# Patient Record
Sex: Male | Born: 1945
Health system: Southern US, Community
[De-identification: ages and names within clinical notes are randomized; demographics above are authoritative.]

## PROBLEM LIST (undated history)

## (undated) DIAGNOSIS — R519 Headache, unspecified: Secondary | ICD-10-CM

## (undated) DIAGNOSIS — R51 Headache: Secondary | ICD-10-CM

## (undated) HISTORY — DX: Headache: R51

## (undated) HISTORY — PX: OTHER SURGICAL HISTORY: SHX169

## (undated) HISTORY — DX: Headache, unspecified: R51.9

## (undated) HISTORY — PX: COLON SURGERY: SHX602

---

## 2005-10-29 ENCOUNTER — Ambulatory Visit: Payer: Self-pay | Admitting: Family Medicine

## 2010-01-08 ENCOUNTER — Ambulatory Visit: Payer: Self-pay | Admitting: Occupational Medicine

## 2010-01-10 ENCOUNTER — Ambulatory Visit: Payer: Self-pay | Admitting: Family Medicine

## 2010-01-10 DIAGNOSIS — J1189 Influenza due to unidentified influenza virus with other manifestations: Secondary | ICD-10-CM

## 2010-01-10 LAB — CONVERTED CEMR LAB: Inflenza A Ag: POSITIVE

## 2010-01-15 ENCOUNTER — Ambulatory Visit: Payer: Self-pay | Admitting: Family Medicine

## 2010-01-15 ENCOUNTER — Telehealth (INDEPENDENT_AMBULATORY_CARE_PROVIDER_SITE_OTHER): Payer: Self-pay | Admitting: *Deleted

## 2010-01-16 LAB — CONVERTED CEMR LAB
Basophils Absolute: 0 10*3/uL (ref 0.0–0.1)
Basophils Relative: 0 % (ref 0–1)
Eosinophils Absolute: 0.1 10*3/uL (ref 0.0–0.7)
Eosinophils Relative: 1 % (ref 0–5)
Hemoglobin: 15.4 g/dL (ref 13.0–17.0)
MCHC: 33.6 g/dL (ref 30.0–36.0)
MCV: 87.1 fL (ref 78.0–100.0)
Monocytes Absolute: 0.7 10*3/uL (ref 0.1–1.0)
Neutro Abs: 3.5 10*3/uL (ref 1.7–7.7)
RDW: 12.5 % (ref 11.5–15.5)

## 2011-01-08 NOTE — Progress Notes (Signed)
Summary: Not feeling any better  Phone Note Call from Patient   Caller: Spouse Summary of Call: Wife states Pt is not feeling any better. C/o being very achy and no appetite. Please advise.  Initial call taken by: Payton Spark CMA,  January 15, 2010 11:02 AM  Follow-up for Phone Call        Has the flu.  If feels getting worse then come in because it can turn into PNA but flu can take 7 days-10 days to recover.  Follow-up by: Nani Gasser MD,  January 15, 2010 11:21 AM  Additional Follow-up for Phone Call Additional follow up Details #1::        Pt came in for apt Additional Follow-up by: Payton Spark CMA,  January 15, 2010 4:47 PM

## 2011-01-08 NOTE — Letter (Signed)
Summary: Out of Work  [facility]  [address]   [place], [place] [address]   Phone: [phone]  Fax: [phone]    January 10, 2010   Employee:  Angel Krueger    To Whom It May Concern:   For Medical reasons, please excuse the above named employee from work for the following dates:  Start:   01-08-2010  End:   12-15-2009  If you need additional information, please feel free to contact our office.         Sincerely,    Nani Gasser MD

## 2011-01-08 NOTE — Assessment & Plan Note (Signed)
Summary: COLD, PAIN W/COUGH/TJ   Vital Signs:  Patient Profile:   65 Years Old Male CC:      Cold & URI symptoms Height:     64 inches Weight:      153 pounds O2 Sat:      99 % O2 treatment:    Room Air Temp:     98.9 degrees F oral Pulse rate:   84 / minute Resp:     16 per minute BP sitting:   141 / 94  (right arm)  Pt. in pain?   yes    Location:   body aches    Type:       aching  Vitals Entered By: Lita Mains, RN                   Updated Prior Medication List: No Medications Current Allergies (reviewed today): ! PCNHistory of Present Illness History from: patient Chief Complaint: Cold & URI symptoms History of Present Illness: Patient has had dry cough, runny nose, body aches, burning eyes X 3 weeks. 10 days ago he received a Z-pak. He was well for about 4-5 days and symptoms returned. He has not c/o fever and is currentlt a-febrile. No history of sinus congestion or pressure.  No sore throat.   He denies any history of seasonal allergies.   No history of asthma.    He did got the flu shot earlier.   He is taking no medications at all.  Has not tried ibuprofen.   He does have a remote history of heartburn, but none recently.   He is a non smoker and has never smoked before.   REVIEW OF SYSTEMS Constitutional Symptoms       Complains of fatigue.     Denies fever, chills, night sweats, weight loss, and weight gain.  Eyes       Complains of eye pain.      Denies change in vision, eye discharge, glasses, contact lenses, and eye surgery. Ear/Nose/Throat/Mouth       Complains of frequent runny nose and hoarseness.      Denies hearing loss/aids, change in hearing, ear pain, ear discharge, dizziness, frequent nose bleeds, sinus problems, sore throat, and tooth pain or bleeding.  Respiratory       Complains of dry cough.      Denies productive cough, wheezing, shortness of breath, asthma, bronchitis, and emphysema/COPD.  Cardiovascular       Denies murmurs, chest pain,  and tires easily with exhertion.    Gastrointestinal       Denies stomach pain, nausea/vomiting, diarrhea, constipation, blood in bowel movements, and indigestion. Genitourniary       Denies painful urination, kidney stones, and loss of urinary control. Neurological       Denies paralysis, seizures, and fainting/blackouts. Musculoskeletal       Denies muscle pain, joint pain, joint stiffness, decreased range of motion, redness, swelling, muscle weakness, and gout.      Comments: body aches Skin       Denies bruising, unusual mles/lumps or sores, and hair/skin or nail changes.  Psych       Denies mood changes, temper/anger issues, anxiety/stress, speech problems, depression, and sleep problems.  Past History:  Past Surgical History: Last updated: 08/14/2007 Panendoscopy  Family History: Last updated: 08/14/2007 Family History Diabetes 1st degree relative  Social History: Last updated: 08/14/2007 Occupation: Married Never Smoked Alcohol use-no Drug use-no Regular exercise-no  Past Medical History: Reviewed history from  08/14/2007 and no changes required. Stomach Ulcer Glaucoma Migraines  Family History: Reviewed history from 08/14/2007 and no changes required. Family History Diabetes 1st degree relative  Social History: Reviewed history from 08/14/2007 and no changes required. Occupation: Married Never Smoked Alcohol use-no Drug use-no Regular exercise-no Physical Exam General appearance: well developed, well nourished, no acute distress Pupils: equal, round, reactive to light Ears: normal, no lesions or deformities Nasal: mucosa pink, nonedematous, no septal deviation, turbinates normal Oral/Pharynx: tongue normal, posterior pharynx without erythema or exudate Neck: neck supple,  trachea midline, no masses Chest/Lungs: no rales, wheezes, or rhonchi bilateral, breath sounds equal without effort  Dry cough Heart: regular rate and  rhythm, no  murmur Assessment New Problems: COUGH (ICD-786.2) UPPER RESPIRATORY INFECTION, ACUTE (ICD-465.9)   Plan New Medications/Changes: PULMICORT FLEXHALER 180 MCG/ACT AEPB (BUDESONIDE) 1 inhalation two times a day  #One inh. x 0, 01/08/2010, Kathrine Haddock MD CHERATUSSIN AC 100-10 MG/5ML SYRP (GUAIFENESIN-CODEINE) 5 ml every 8 hours for cough  #4 oz x 0, 01/08/2010, Kathrine Haddock MD  New Orders: T-Chest x-ray, 2 views [71020] New Patient Level III [99203] Planning Comments:   I think this allergies may be the source of his cough Recommend Pulmicort two times a day and Cheratussin at night Recommend Claritin otc as directed Recommend follow up with PCP next week if no better.   Chest x-ray was negative.   The patient and/or caregiver has been counseled thoroughly with regard to medications prescribed including dosage, schedule, interactions, rationale for use, and possible side effects and they verbalize understanding.  Diagnoses and expected course of recovery discussed and will return if not improved as expected or if the condition worsens. Patient and/or caregiver verbalized understanding.  Prescriptions: PULMICORT FLEXHALER 180 MCG/ACT AEPB (BUDESONIDE) 1 inhalation two times a day  #One inh. x 0   Entered and Authorized by:   Kathrine Haddock MD   Signed by:   Kathrine Haddock MD on 01/08/2010   Method used:   Print then Give to Patient   RxID:   [id number] CHERATUSSIN AC 100-10 MG/5ML SYRP (GUAIFENESIN-CODEINE) 5 ml every 8 hours for cough  #4 oz x 0   Entered and Authorized by:   Kathrine Haddock MD   Signed by:   Kathrine Haddock MD on 01/08/2010   Method used:   Print then Give to Patient   RxID:   [id number]

## 2011-01-08 NOTE — Assessment & Plan Note (Signed)
Summary: NOV: Flu   Vital Signs:  Patient profile:   65 year old male Height:      64 inches Weight:      154 pounds BMI:     26.53 O2 Sat:      96 % on Room air Temp:     98.6 degrees F oral Pulse rate:   78 / minute BP sitting:   127 / 75  (left arm) Cuff size:   regular  Vitals Entered By: Kathlene November (January 10, 2010 11:15 AM)  O2 Flow:  Room air CC: cough and bodyaches since Monday Is Patient Diabetic? No   CC:  cough and bodyaches since Monday.  History of Present Illness: cough and bodyaches since Sunday night.  Went to UC on Monday and given a cough medicine and inhaler. Had a normal CXR.  Feels cold like chills.  No fevers.  Cough is more dry. No hx of lungs problems.  Not a smoker. cough med only works for about 2 hours.  Tooka zpack for about 2 weeks and was better for about a week. Feels weak all over.  Muscles are achey.  Did have a flu shot. No runny nose.   Habits & Providers  Alcohol-Tobacco-Diet     Alcohol drinks/day: 0     Tobacco Status: never  Exercise-Depression-Behavior     Does Patient Exercise: no     STD Risk: never     Drug Use: never     Seat Belt Use: always  Current Medications (verified): 1)  Cheratussin Ac 100-10 Mg/49ml Syrp (Guaifenesin-Codeine) .... 5 Ml Every 8 Hours For Cough 2)  Pulmicort Flexhaler 180 Mcg/act Aepb (Budesonide) .Marland Kitchen.. 1 Inhalation Two Times A Day 3)  Zithromax 250 Mg Tabs (Azithromycin) 4)  Propranolol Hcl 80 Mg Tabs (Propranolol Hcl) .... Take One Tablet By Mouth Once A Day  Allergies (verified): 1)  ! Pcn  Comments:  Nurse/Medical Assistant: The patient's medications and allergies were reviewed with the patient and were updated in the Medication and Allergy Lists. Kathlene November (January 10, 2010 11:18 AM)  Past History:  Past Medical History: Last updated: 08/14/2007 Stomach Ulcer Glaucoma Migraines  Past Surgical History: Last updated: 08/14/2007 Panendoscopy  Family History: Reviewed history  from 08/14/2007 and no changes required. 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  Married to Ramia with 2 kids. Never Smoked Alcohol use-no Drug use-no Regular exercise-no STD Risk:  never Drug Use:  never Seat Belt Use:  always  Review of Systems       No fever/sweats/weakness, unexplained weight loss/gain.  No vison changes.  No difficulty hearing/ringing in ears, hay fever/allergies.  No chest pain/discomfort, palpitations.  No Br lump/nipple discharge.  No cough/wheeze.  No blood in BM, nausea/vomiting/diarrhea.  No nighttime urination, leaking urine, unusual vaginal bleeding, discharge (penis or vagina).  No muscle/joint pain. No rash, change in mole.  No HA, memory loss.  No anxiety, sleep d/o, depression.  No easy bruising/bleeding, unexplained lump   Physical Exam  General:  Well-developed,well-nourished,in no acute distress; alert,appropriate and cooperative throughout examination Head:  Normocephalic and atraumatic without obvious abnormalities. No apparent alopecia or balding. Eyes:  No corneal or conjunctival inflammation noted. EOMI. Perrla.  Ears:  External ear exam shows no significant lesions or deformities.  Otoscopic examination reveals clear canals, tympanic membranes are intact bilaterally without bulging, retraction, inflammation or discharge. Hearing is grossly normal bilaterally. Nose:  External nasal examination shows no deformity  or inflammation. Nasal mucosa are pink and moist without lesions or exudates. Mouth:  Oral mucosa and oropharynx without lesions or exudates.  Teeth in good repair. Neck:  No deformities, masses, or tenderness noted. Lungs:  Normal respiratory effort, chest expands symmetrically. Lungs are clear to auscultation, no crackles or wheezes. Heart:  Normal rate and regular rhythm. S1 and S2 normal without gallop, murmur, click, rub or other extra sounds. Pulses:  Radial 2+  Neurologic:  alert &  oriented X3.   Skin:  no rashes.   Cervical Nodes:  No lymphadenopathy noted Psych:  Cognition and judgment appear intact. Alert and cooperative with normal attention span and concentration. No apparent delusions, illusions, hallucinations   Impression & Recommendations:  Problem # 1:  INFLUENZA (ICD-487.8)  Out of the window for tamiflu. Symtomatic care. Cough med given. Call if not better in 1 week. IBU or Tyelnol for bodyaches.   Orders: Flu A+B ([address])  Complete Medication List: 1)  Hydrocodone-homatropine 5-1.5 Mg/77ml Syrp (Hydrocodone-homatropine) .... 5ml by mouth up to three times a day as needed cough. 2)  Propranolol Hcl 80 Mg Tabs (Propranolol hcl) .... Take one tablet by mouth once a day Prescriptions: HYDROCODONE-HOMATROPINE 5-1.5 MG/5ML SYRP (HYDROCODONE-HOMATROPINE) 5ml by mouth up to three times a day as needed cough.  #131ml x 0   Entered and Authorized by:   Nani Gasser MD   Signed by:   Nani Gasser MD on 01/10/2010   Method used:   Print then Give to Patient   RxID:   [id number]   Laboratory Results  Date/Time Received: 01/10/2010 Date/Time Reported: 01/10/2010  Other Tests  Influenza A: positive Influenza B: positive

## 2011-01-08 NOTE — Assessment & Plan Note (Signed)
Summary: F/U flu   Vital Signs:  Patient profile:   65 year old male Height:      64 inches Weight:      144.75 pounds BMI:     24.94 Temp:     97.8 degrees F oral Pulse rate:   70 / minute BP sitting:   140 / 90 CC: c/o not feeling any better, achy,weak, no appetite   CC:  c/o not feeling any better, achy, weak, and no appetite.  History of Present Illness: c/o not feeling any better, achy,weak, no appetite. No cough or fever.  Last fever a few days ago. No energy. No SOB.  Vomited  a couple of days ago. Decreased appetie. No chest or back pain. Says the bodyaches are better.   Allergies: 1)  ! Pcn  Physical Exam  General:  Well-developed,well-nourished,in no acute distress; alert,appropriate and cooperative throughout examination Head:  Normocephalic and atraumatic without obvious abnormalities. No apparent alopecia or balding. Eyes:  No corneal or conjunctival inflammation noted. EOMI. Perrla.  Ears:  External ear exam shows no significant lesions or deformities.  Otoscopic examination reveals clear canals, tympanic membranes are intact bilaterally without bulging, retraction, inflammation or discharge. Hearing is grossly normal bilaterally. Nose:  External nasal examination shows no deformity or inflammation. Nasal mucosa are pink and moist without lesions or exudates. Mouth:  Oral mucosa and oropharynx without lesions or exudates.  Teeth in good repair. Neck:  No deformities, masses, or tenderness noted. Lungs:  Normal respiratory effort, chest expands symmetrically. Lungs are clear to auscultation, no crackles or wheezes. Heart:  Normal rate and regular rhythm. S1 and S2 normal without gallop, murmur, click, rub or other extra sounds. Abdomen:  Bowel sounds positive,abdomen soft and non-tender without masses, organomegaly or hernias noted. Pulses:  Radial 2+  Neurologic:  alert & oriented X3.   Skin:  no rashes.   Cervical Nodes:  No lymphadenopathy noted Psych:   Cognition and judgment appear intact. Alert and cooperative with normal attention span and concentration. No apparent delusions, illusions, hallucinations   Impression & Recommendations:  Problem # 1:  INFLUENZA (ICD-487.8) Much improved stil having excesive fatigue so will check CBC. Explained realy just needs to give it a few more days and increase calorie intake and fluids.   Orders: T-CBC w/Diff ([id number])  Complete Medication List: 1)  Hydrocodone-homatropine 5-1.5 Mg/50ml Syrp (Hydrocodone-homatropine) .... 5ml by mouth up to three times a day as needed cough. 2)  Propranolol Hcl 80 Mg Tabs (Propranolol hcl) .... Take one tablet by mouth once a day

## 2011-01-08 NOTE — Letter (Signed)
Summary: Out of Work  [facility]  [address]   [place], [place] [address]   Phone: [phone]  Fax: [phone]    January 15, 2010   Employee:  Angel Krueger    To Whom It May Concern:   For Medical reasons, please excuse the above named employee from work for the following dates:  Start:   01-08-2010  End:   01-17-2010  If you need additional information, please feel free to contact our office.         Sincerely,    Nani Gasser MD

## 2013-02-09 ENCOUNTER — Ambulatory Visit (INDEPENDENT_AMBULATORY_CARE_PROVIDER_SITE_OTHER): Payer: Medicare Other

## 2013-02-09 ENCOUNTER — Ambulatory Visit (INDEPENDENT_AMBULATORY_CARE_PROVIDER_SITE_OTHER): Payer: Medicare Other | Admitting: Family Medicine

## 2013-02-09 ENCOUNTER — Encounter: Payer: Self-pay | Admitting: Family Medicine

## 2013-02-09 VITALS — BP 168/92 | HR 60 | Temp 97.9°F | Wt 159.0 lb

## 2013-02-09 DIAGNOSIS — N201 Calculus of ureter: Secondary | ICD-10-CM

## 2013-02-09 DIAGNOSIS — N1330 Unspecified hydronephrosis: Secondary | ICD-10-CM

## 2013-02-09 DIAGNOSIS — M549 Dorsalgia, unspecified: Secondary | ICD-10-CM

## 2013-02-09 DIAGNOSIS — R319 Hematuria, unspecified: Secondary | ICD-10-CM

## 2013-02-09 DIAGNOSIS — R109 Unspecified abdominal pain: Secondary | ICD-10-CM

## 2013-02-09 DIAGNOSIS — N200 Calculus of kidney: Secondary | ICD-10-CM

## 2013-02-09 LAB — POCT URINALYSIS DIPSTICK
Bilirubin, UA: NEGATIVE
Glucose, UA: NEGATIVE
Leukocytes, UA: NEGATIVE
Nitrite, UA: NEGATIVE
Urobilinogen, UA: 0.2

## 2013-02-09 MED ORDER — KETOROLAC TROMETHAMINE 60 MG/2ML IM SOLN
60.0000 mg | Freq: Once | INTRAMUSCULAR | Status: AC
  Administered 2013-02-09: 60 mg via INTRAMUSCULAR

## 2013-02-09 MED ORDER — HYDROCODONE-ACETAMINOPHEN 5-325 MG PO TABS: 2.0000 | ORAL_TABLET | Freq: Four times a day (QID) | ORAL | Status: DC | PRN

## 2013-02-09 MED ORDER — TAMSULOSIN HCL 0.4 MG PO CAPS: 0.4000 mg | ORAL_CAPSULE | Freq: Every day | ORAL | Status: DC

## 2013-02-09 MED ORDER — ONDANSETRON HCL 4 MG PO TABS: 4.0000 mg | ORAL_TABLET | Freq: Every day | ORAL | Status: DC | PRN

## 2013-02-09 NOTE — Progress Notes (Addendum)
CC: Angel Krueger is a 66 y.o. male is here for Pyelonephritis   Subjective: HPI:  Patient has not been seen in over three years at our clinic.  Patient was in his regular state of health up until 12:00 today when he had acute onset of 10 out of 10 right flank pain with nausea and urinary frequency. His pain has subsided to now being in moderate severity. He is unable to influence his pain for better or for worse with positional movements. His pain is not related to urinary habits other than that described above. He now localizes his pain to the right flank pain which now radiates just above the right testicle. He denies change in the color or odor consistency of his urine. He denies chest pain, cough, SOB, abdominal pain, fevers, chills, confusion, nor pelvic pain other than that described above. Denies testicular pain nor swelling.  Denies diarrhea nor constipation. Denies swollen lymph nodes or easy bruisability. Denies musculoskeletal pain, skin changes/rash, nor motor/sensory disturbances.  He denies any recent trauma or overexertion. Nothing seems to make the pain better or worse. It has been constant since onset although somewhat improving without interventions. It is described as a "deep and sharp "pain.  He denies midline back pain, saddle paresthesia, nor bowel or bladder incontinence. No personal or family history of kidney stones. He has never had a urinary tract infection before.    Review Of Systems Outlined In HPI  Past Medical History  Diagnosis Date  . Generalized headaches      History reviewed. No pertinent family history.   History  Substance Use Topics  . Smoking status: Never Smoker   . Smokeless tobacco: Not on file  . Alcohol Use: Not on file     Objective: Filed Vitals:   02/09/13 1458  BP: 168/92  Pulse: 60  Temp: 97.9 F (36.6 C)    General: Alert and Oriented,  appears to be in mild to moderate pain/discomfort  HEENT: Pupils equal, round, reactive to  light. Conjunctivae clear.   moist mucous membranes  Lungs: Clear to auscultation bilaterally, no wheezing/ronchi/rales.  Comfortable work of breathing. Good air movement. Cardiac: Regular rate and rhythm. Normal S1/S2.  No murmurs, rubs, nor gallops.   Abdomen: Normal bowel sounds, soft and non tender without palpable masses. Back: No CVA tenderness  Extremities: No peripheral edema.  Strong peripheral pulses.  Mental Status: No depression, anxiety, nor agitation. Skin: Warm and dry.  Assessment & Plan: Angel Krueger was seen today for pyelonephritis.  Diagnoses and associated orders for this visit:  Right flank pain - CT Abdomen Pelvis Wo Contrast; Future - ketorolac (TORADOL) injection 60 mg; Inject 2 mLs (60 mg total) into the muscle once.  Back pain - Urine Culture - Urinalysis Dipstick - ketorolac (TORADOL) injection 60 mg; Inject 2 mLs (60 mg total) into the muscle once.  Hematuria - CT Abdomen Pelvis Wo Contrast; Future  Nephrolithiasis - ondansetron (ZOFRAN) 4 MG tablet; Take 1 tablet (4 mg total) by mouth daily as needed for nausea. - HYDROcodone-acetaminophen (NORCO/VICODIN) 5-325 MG per tablet; Take 2 tablets by mouth every 6 (six) hours as needed for pain. - tamsulosin (FLOMAX) 0.4 MG CAPS; Take 1 capsule (0.4 mg total) by mouth daily.    High suspicion for nephrolithiasis given symptoms and blood on urinalysis. Patient was sent for a stat CT scan without contrast.   CT scan revealed mild hydronephrosis on the right side with a 2 mm stone at the UVJ. Anticipate successful passage  do to size of the stone, he was started on Flomax and given as needed hydrocodone and Zofran. Patient and wife are in agreement of presenting to emergency room if pain is uncontrolled tonight or vomiting occurs. I've asked him to followup with me or his PCP on Friday. He was given a single injection of Toradol 60 mg with great improvement of his pain. He was given a strainer in hopes of collecting the  stone  Return in about 3 days (around 02/12/2013).

## 2013-02-10 ENCOUNTER — Other Ambulatory Visit: Payer: Self-pay | Admitting: *Deleted

## 2013-02-10 MED ORDER — PROMETHAZINE HCL 25 MG PO TABS: 25.0000 mg | ORAL_TABLET | Freq: Four times a day (QID) | ORAL | Status: DC | PRN

## 2013-02-13 LAB — URINE CULTURE: Colony Count: 35000

## 2013-02-15 ENCOUNTER — Ambulatory Visit (INDEPENDENT_AMBULATORY_CARE_PROVIDER_SITE_OTHER): Payer: Medicare Other | Admitting: Family Medicine

## 2013-02-15 ENCOUNTER — Encounter: Payer: Self-pay | Admitting: Family Medicine

## 2013-02-15 VITALS — BP 140/89 | HR 74 | Ht 70.0 in | Wt 160.0 lb

## 2013-02-15 DIAGNOSIS — Z8669 Personal history of other diseases of the nervous system and sense organs: Secondary | ICD-10-CM | POA: Insufficient documentation

## 2013-02-15 LAB — LIPID PANEL
Cholesterol: 192 mg/dL (ref 0–200)
LDL Cholesterol: 126 mg/dL — ABNORMAL HIGH (ref 0–99)
VLDL: 15 mg/dL (ref 0–40)

## 2013-02-15 MED ORDER — ZOSTER VACCINE LIVE 19400 UNT/0.65ML ~~LOC~~ SOLR: 0.6500 mL | Freq: Once | SUBCUTANEOUS | Status: DC

## 2013-02-15 MED ORDER — FROVATRIPTAN SUCCINATE 2.5 MG PO TABS: 2.5000 mg | ORAL_TABLET | ORAL | Status: DC | PRN

## 2013-02-15 MED ORDER — TRIAMCINOLONE ACETONIDE 0.1 % EX CREA: TOPICAL_CREAM | Freq: Two times a day (BID) | CUTANEOUS | Status: DC

## 2013-02-15 NOTE — Progress Notes (Signed)
Subjective:    Angel Krueger is a 67 y.o. male who presents for Medicare Annual/Subsequent preventive examination.   Preventive Screening-Counseling & Management  Tobacco History  Smoking status  . Never Smoker   Smokeless tobacco  . Not on file    Problems Prior to Visit 1. nephrolithiasis  Current Problems (verified) Patient Active Problem List  Diagnosis  . INFLUENZA    Medications Prior to Visit Current Outpatient Prescriptions on File Prior to Visit  Medication Sig Dispense Refill  . tamsulosin (FLOMAX) 0.4 MG CAPS Take 1 capsule (0.4 mg total) by mouth daily.  30 capsule  0   No current facility-administered medications on file prior to visit.    Current Medications (verified) Current Outpatient Prescriptions  Medication Sig Dispense Refill  . BRIMONIDINE TARTRATE OP Apply to eye.      . dorzolamide (TRUSOPT) 2 % ophthalmic solution 1 drop 3 (three) times daily.      Marland Kitchen LATANOPROST OP Apply to eye.      . propranolol (INDERAL) 80 MG tablet Take 80 mg by mouth daily.      . tamsulosin (FLOMAX) 0.4 MG CAPS Take 1 capsule (0.4 mg total) by mouth daily.  30 capsule  0   No current facility-administered medications for this visit.     Allergies (verified) Penicillins   PAST HISTORY  Family History No family history on file.  Social History History  Substance Use Topics  . Smoking status: Never Smoker   . Smokeless tobacco: Not on file  . Alcohol Use: Not on file    Are there smokers in your home (other than you)?  No  Risk Factors Current exercise habits: The patient does not participate in regular exercise at present.  Dietary issues discussed: varried diet with veggies, dairy, and whole grains   Cardiac risk factors: advanced age (older than 33 for men, 68 for women) and hypertension.  Depression Screen (Note: if answer to either of the following is "Yes", a more complete depression screening is indicated)   Q1: Over the past two weeks, have you  felt down, depressed or hopeless? No  Q2: Over the past two weeks, have you felt little interest or pleasure in doing things? No  Have you lost interest or pleasure in daily life? No  Do you often feel hopeless? No  Do you cry easily over simple problems? No  Activities of Daily Living In your present state of health, do you have any difficulty performing the following activities?:  Driving? No Managing money?  No Feeding yourself? No Getting from bed to chair? No Climbing a flight of stairs? No Preparing food and eating?: No Bathing or showering? No Getting dressed: No Getting to the toilet? No Using the toilet:No Moving around from place to place: No In the past year have you fallen or had a near fall?:No   Are you sexually active?  No  Do you have more than one partner?  No  Hearing Difficulties: No Do you often ask people to speak up or repeat themselves? No Do you experience ringing or noises in your ears? No Do you have difficulty understanding soft or whispered voices? No   Do you feel that you have a problem with memory? No  Do you often misplace items? Yes  Do you feel safe at home?  Yes  Cognitive Testing  Alert? Yes  Normal Appearance?Yes  Oriented to person? Yes  Place? Yes   Time? Yes  Recall of three objects?  No  Can perform simple calculations? Yes  Displays appropriate judgment?Yes  Can read the correct time from a watch face?Yes   Advanced Directives have been discussed with the patient? Yes   List the Names of Other Physician/Practitioners you currently use: 1.    Indicate any recent Medical Services you may have received from other than [facility] providers in the past year (date may be approximate).  Immunization History  Administered Date(s) Administered  . Influenza Whole 09/08/2005    Screening Tests Health Maintenance  Topic Date Due  . Tetanus/tdap  09/19/1966  . Colonoscopy  09/19/1997  . Influenza Vaccine  08/09/2006  . Zostavax   09/20/2007  . Pneumococcal Polysaccharide Vaccine Age 16 And Over  09/19/2012    All answers were reviewed with the patient and necessary referrals were made:  Laren Boom, DO   02/15/2013   History reviewed: allergies, current medications, past family history, past medical history, past social history, past surgical history and problem list  Review of Systems Review of Systems - General ROS: negative for - chills, fever, night sweats, weight gain or weight loss Ophthalmic ROS: negative for - decreased vision Psychological ROS: negative for - anxiety or depression ENT ROS: negative for - hearing change, nasal congestion, tinnitus or allergies Hematological and Lymphatic ROS: negative for - bleeding problems, bruising or swollen lymph nodes Breast ROS: negative Respiratory ROS: no cough, shortness of breath, or wheezing Cardiovascular ROS: no chest pain or dyspnea on exertion Gastrointestinal ROS: no abdominal pain, change in bowel habits, or black or bloody stools Genito-Urinary ROS: negative for - genital discharge, genital ulcers, incontinence or abnormal bleeding from genitals Musculoskeletal ROS: negative for - joint pain or muscle pain Neurological ROS: negative for - headaches or memory loss Dermatological ROS: negative for lumps, mole changes, rash and skin lesion changes  Objective:     Vision by Snellen chart: right eye:20/40, left eye:20/25 Blood pressure 140/89, pulse 74, height 5\' 10"  (1.778 m), weight 160 lb (72.576 kg). Body mass index is 22.96 kg/(m^2).  General: No Acute Distress HEENT: Atraumatic, normocephalic, conjunctivae normal without scleral icterus.  No nasal discharge, hearing grossly intact, TMs with good landmarks bilaterally with small left TM perforation. with no middle ear abnormalities, posterior pharynx clear without oral lesions. Neck: Supple, trachea midline, no cervical nor supraclavicular adenopathy. Pulmonary: Clear to auscultation bilaterally  without wheezing, rhonchi, nor rales. Cardiac: Regular rate and rhythm.  No murmurs, rubs, nor gallops. No peripheral edema.  2+ peripheral pulses bilaterally. Abdomen: Bowel sounds normal.  No masses.  Non-tender without rebound.  Negative Murphy's sign. MSK: Grossly intact, no signs of weakness.  Full strength throughout upper and lower extremities.  Full ROM in upper and lower extremities.  No midline spinal tenderness. Neuro: Gait unremarkable, CN II-XII grossly intact.  C5-C6 Reflex 2/4 Bilaterally, L4 Reflex 2/4 Bilaterally.  Cerebellar function intact. Skin: Eczematous patch behind left ear Psych: Alert and oriented to person/place/time.  Thought process normal. No anxiety/depression.     Assessment:     Could only recall 2 of 3 words after clock distraction, short-term mild memory loss not interfere with quality of life.     Plan:     During the course of the visit the patient was educated and counseled about appropriate screening and preventive services including:    Pneumococcal vaccine   Prostate cancer screening  Colorectal cancer screening  Diabetes screening  Discussed precautions regarding his left tympanic membrane perforation and a recheck in 1-3 months.Healthy lifestyle interventions including but  limited to regular exercise, a healthy low fat diet, moderation of salt intake, the dangers of tobacco/alcohol/recreational drug use, nutrition supplementation, and accident avoidance were discussed with the patient and a handout was provided for future reference. Discussed returning at his convenience for Mini-Mental state exam to further look into memory loss with possibility of labs however he prefer to not have this done.  Diet review for nutrition referral?   Not Indicated   Patient Instructions (the written plan) was given to the patient.  Medicare Attestation I have personally reviewed: The patient's medical and social history Their use of alcohol, tobacco or  illicit drugs Their current medications and supplements The patient's functional ability including ADLs,fall risks, home safety risks, cognitive, and hearing and visual impairment Diet and physical activities Evidence for depression or mood disorders  The patient's weight, height, BMI, and visual acuity have been recorded in the chart.  I have made referrals, counseling, and provided education to the patient based on review of the above and I have provided the patient with a written personalized care plan for preventive services.     Laren Boom, DO   02/15/2013

## 2013-02-16 ENCOUNTER — Encounter: Payer: Self-pay | Admitting: Family Medicine

## 2013-02-16 ENCOUNTER — Other Ambulatory Visit: Payer: Self-pay | Admitting: Family Medicine

## 2013-02-16 DIAGNOSIS — E785 Hyperlipidemia, unspecified: Secondary | ICD-10-CM | POA: Insufficient documentation

## 2013-02-16 LAB — BASIC METABOLIC PANEL WITH GFR
BUN: 12 mg/dL (ref 6–23)
CO2: 29 mEq/L (ref 19–32)
Chloride: 103 mEq/L (ref 96–112)
GFR, Est Non African American: 78 mL/min
Potassium: 4.4 mEq/L (ref 3.5–5.3)

## 2013-03-15 ENCOUNTER — Ambulatory Visit (INDEPENDENT_AMBULATORY_CARE_PROVIDER_SITE_OTHER): Payer: Medicare Other | Admitting: Family Medicine

## 2013-03-15 VITALS — BP 124/73 | HR 72 | Wt 159.0 lb

## 2013-03-15 DIAGNOSIS — I10 Essential (primary) hypertension: Secondary | ICD-10-CM | POA: Insufficient documentation

## 2013-03-15 DIAGNOSIS — R030 Elevated blood-pressure reading, without diagnosis of hypertension: Secondary | ICD-10-CM

## 2013-03-15 DIAGNOSIS — G43909 Migraine, unspecified, not intractable, without status migrainosus: Secondary | ICD-10-CM

## 2013-03-15 DIAGNOSIS — Z1211 Encounter for screening for malignant neoplasm of colon: Secondary | ICD-10-CM

## 2013-03-15 MED ORDER — FROVATRIPTAN SUCCINATE 2.5 MG PO TABS: 2.5000 mg | ORAL_TABLET | ORAL | Status: DC | PRN

## 2013-03-15 NOTE — Progress Notes (Signed)
CC: Angel Krueger is a 67 y.o. male is here for Hypertension   Subjective: HPI:  Followup elevated blood pressure  No outside blood pressures to report. At recent visits for nephrolithiasis was found to be hypertensive, at recent complete physical exam still mildly hypertensive. Since then has focus on diet and exercise interventions. Denies any past history of hypertension. Takes propranolol for migraine prevention. Denies new headaches, motor sensory disturbances, chest pain, shortness of breath, orthopnea, peripheral edema.  Patient reports being put on propranolol years ago for migraines. Since I saw him last he's had one that responded to frovia samples.  Character and frequency of headaches/migraine have not changed over the years. He would like to know if he can stop propranolol.   Review Of Systems Outlined In HPI  Past Medical History  Diagnosis Date  . Generalized headaches      No family history on file.   History  Substance Use Topics  . Smoking status: Never Smoker   . Smokeless tobacco: Not on file  . Alcohol Use: Not on file     Objective: Filed Vitals:   03/15/13 0821  BP: 124/73  Pulse: 72    Vital signs reviewed. General: Alert and Oriented, No Acute Distress HEENT: Pupils equal, round, reactive to light. Conjunctivae clear.  External ears unremarkable.  Moist mucous membranes. Lungs: Clear and comfortable work of breathing, speaking in full sentences without accessory muscle use. No wheezing rhonchi nor rales Cardiac: Regular rate and rhythm.  No murmurs Neuro: CN II-XII grossly intact, gait normal. Extremities: No peripheral edema.  Strong peripheral pulses.  Mental Status: No depression, anxiety, nor agitation. Logical though process. Skin: Warm and dry.  Assessment & Plan: Angel Krueger was seen today for hypertension.  Diagnoses and associated orders for this visit:  Screening for colorectal cancer - Ambulatory referral to  Gastroenterology  Migraine - frovatriptan (FROVA) 2.5 MG tablet; Take 1 tablet (2.5 mg total) by mouth as needed for migraine. If recurs, may repeat after 2 hours. Max of 3 tabs in 24 hours.  Elevated BP    Patient is having trouble coordinating insurance with [facility] for colonoscopy, he would like a referral to someone local. Migraine: Per patient's wishes he will stop propranolol for the next week or 2 but will restart if having more than one migraine a week. Elevated blood pressure: Improved now he is no longer in pain, continue dietary and lifestyle interventions, doubt that stopping propranolol will make a significant change to blood pressure.  Return in about 6 months (around 09/14/2013).

## 2013-04-20 ENCOUNTER — Encounter: Payer: Self-pay | Admitting: Family Medicine

## 2013-04-20 DIAGNOSIS — K635 Polyp of colon: Secondary | ICD-10-CM | POA: Insufficient documentation

## 2014-06-02 ENCOUNTER — Encounter: Payer: Medicare Other | Admitting: Family Medicine

## 2015-01-19 ENCOUNTER — Encounter: Payer: Medicare Other | Admitting: Family Medicine

## 2015-01-27 ENCOUNTER — Ambulatory Visit (INDEPENDENT_AMBULATORY_CARE_PROVIDER_SITE_OTHER): Payer: Commercial Managed Care - HMO | Admitting: Family Medicine

## 2015-01-27 ENCOUNTER — Encounter: Payer: Self-pay | Admitting: Family Medicine

## 2015-01-27 VITALS — BP 147/89 | HR 67 | Ht 70.0 in | Wt 159.0 lb

## 2015-01-27 DIAGNOSIS — Z23 Encounter for immunization: Secondary | ICD-10-CM | POA: Diagnosis not present

## 2015-01-27 DIAGNOSIS — N529 Male erectile dysfunction, unspecified: Secondary | ICD-10-CM

## 2015-01-27 DIAGNOSIS — I10 Essential (primary) hypertension: Secondary | ICD-10-CM | POA: Diagnosis not present

## 2015-01-27 DIAGNOSIS — Z125 Encounter for screening for malignant neoplasm of prostate: Secondary | ICD-10-CM | POA: Diagnosis not present

## 2015-01-27 DIAGNOSIS — Z0000 Encounter for general adult medical examination without abnormal findings: Secondary | ICD-10-CM

## 2015-01-27 DIAGNOSIS — L819 Disorder of pigmentation, unspecified: Secondary | ICD-10-CM | POA: Diagnosis not present

## 2015-01-27 DIAGNOSIS — Z79899 Other long term (current) drug therapy: Secondary | ICD-10-CM | POA: Diagnosis not present

## 2015-01-27 LAB — COMPLETE METABOLIC PANEL WITH GFR
ALK PHOS: 66 U/L (ref 39–117)
ALT: 18 U/L (ref 0–53)
AST: 20 U/L (ref 0–37)
Albumin: 4.2 g/dL (ref 3.5–5.2)
BUN: 16 mg/dL (ref 6–23)
CO2: 30 mEq/L (ref 19–32)
CREATININE: 0.9 mg/dL (ref 0.50–1.35)
Calcium: 9.6 mg/dL (ref 8.4–10.5)
Chloride: 104 mEq/L (ref 96–112)
GFR, Est African American: >89 mL/min
GFR, Est Non African American: 88 mL/min
Glucose, Bld: 101 mg/dL — ABNORMAL HIGH (ref 70–99)
POTASSIUM: 4.7 meq/L (ref 3.5–5.3)
Sodium: 139 mEq/L (ref 135–145)
Total Bilirubin: 0.6 mg/dL (ref 0.2–1.2)
Total Protein: 7.6 g/dL (ref 6.0–8.3)

## 2015-01-27 LAB — CBC
HCT: 49.1 % (ref 39.0–52.0)
Hemoglobin: 16.6 g/dL (ref 13.0–17.0)
MCH: 29.4 pg (ref 26.0–34.0)
MCHC: 33.8 g/dL (ref 30.0–36.0)
MCV: 86.9 fL (ref 78.0–100.0)
MPV: 11.2 fL (ref 8.6–12.4)
PLATELETS: 178 10*3/uL (ref 150–400)
RBC: 5.65 MIL/uL (ref 4.22–5.81)
RDW: 14 % (ref 11.5–15.5)
WBC: 6.7 10*3/uL (ref 4.0–10.5)

## 2015-01-27 LAB — LIPID PANEL
Cholesterol: 188 mg/dL (ref 0–200)
HDL: 49 mg/dL (ref >39)
LDL Cholesterol: 121 mg/dL — ABNORMAL HIGH (ref 0–99)
TRIGLYCERIDES: 90 mg/dL (ref <150)
Total CHOL/HDL Ratio: 3.8 Ratio
VLDL: 18 mg/dL (ref 0–40)

## 2015-01-27 MED ORDER — ZOSTER VACCINE LIVE 19400 UNT/0.65ML ~~LOC~~ SOLR: 0.6500 mL | Freq: Once | SUBCUTANEOUS | Status: DC

## 2015-01-27 MED ORDER — IBUPROFEN 800 MG PO TABS: 800.0000 mg | ORAL_TABLET | Freq: Three times a day (TID) | ORAL | Status: DC | PRN

## 2015-01-27 MED ORDER — SILDENAFIL CITRATE 100 MG PO TABS
50.0000 mg | ORAL_TABLET | Freq: Every day | ORAL | Status: DC | PRN
Start: 2015-01-27 — End: 2016-09-09

## 2015-01-27 MED ORDER — PROPRANOLOL HCL 80 MG PO TABS: 80.0000 mg | ORAL_TABLET | Freq: Two times a day (BID) | ORAL | Status: DC

## 2015-01-27 NOTE — Patient Instructions (Addendum)
Melatonin 3-5mg  every night as needed for sleep issues.   Hydrogen Peroxide placed on a cotton ball then placed in your ear canal for 5 minutes will help reduce wax in the ear.

## 2015-01-27 NOTE — Progress Notes (Signed)
Subjective:    Angel Krueger is a 69 y.o. male who presents for Medicare Initial preventive examination.   Preventive Screening-Counseling & Management  Tobacco History  Smoking status  . Never Smoker   Smokeless tobacco  . Not on file   Colonoscopy: Receive this in 2014 not due for another 8 years Prostate: Discussed screening risks/beneifts with patient during today's visit, he would like to have a PSA checked   Influenza Vaccine: Already received this season Pneumovax: Will receive Prevnar today Td/Tdap: He believes he has had tetanus within the last 10 years Zoster: Overdue, prescription provided today  Complains of a headache that's present every day only in the mornings. It's a pounding sensation in his temples. It slowly improves over a matter of hours. It's worse with leaning forward. No outside blood pressures to report but stopped propranolol 2 years ago no other motor or sensory disturbances other than that described below  Would like to know if he can have something like Cialis or Viagra for erectile dysfunction difficulty both initiating and maintaining an erection. He denies exertional chest pain or chest pain in any other circumstances  He has a pigmented patch on the medial side of his third toe that he's had for matter of years that he's never had evaluated.   Problems Prior to Visit 1. See above  Current Problems (verified) Patient Active Problem List   Diagnosis Date Noted  . Hyperplastic colon polyp 04/20/2013  . Elevated BP 03/15/2013  . Hyperlipidemia LDL goal < 130 02/16/2013  . History of migraine 02/15/2013  . INFLUENZA 01/10/2010    Medications Prior to Visit Current Outpatient Prescriptions on File Prior to Visit  Medication Sig Dispense Refill  . aspirin EC 81 MG tablet Take 1 tablet (81 mg total) by mouth daily. 150 tablet 2  . BRIMONIDINE TARTRATE OP Apply to eye.    . dorzolamide (TRUSOPT) 2 % ophthalmic solution 1 drop 3 (three) times  daily.    Marland Kitchen. LATANOPROST OP Apply to eye.    . frovatriptan (FROVA) 2.5 MG tablet Take 1 tablet (2.5 mg total) by mouth as needed for migraine. If recurs, may repeat after 2 hours. Max of 3 tabs in 24 hours. (Patient not taking: Reported on 01/27/2015) 10 tablet 3  . propranolol (INDERAL) 80 MG tablet Take 80 mg by mouth daily.     No current facility-administered medications on file prior to visit.    Current Medications (verified) Current Outpatient Prescriptions  Medication Sig Dispense Refill  . aspirin EC 81 MG tablet Take 1 tablet (81 mg total) by mouth daily. 150 tablet 2  . BRIMONIDINE TARTRATE OP Apply to eye.    . dorzolamide (TRUSOPT) 2 % ophthalmic solution 1 drop 3 (three) times daily.    Marland Kitchen. LATANOPROST OP Apply to eye.    . vitamin B-12 (CYANOCOBALAMIN) 1000 MCG tablet Take 1,000 mcg by mouth daily.    . frovatriptan (FROVA) 2.5 MG tablet Take 1 tablet (2.5 mg total) by mouth as needed for migraine. If recurs, may repeat after 2 hours. Max of 3 tabs in 24 hours. (Patient not taking: Reported on 01/27/2015) 10 tablet 3  . propranolol (INDERAL) 80 MG tablet Take 80 mg by mouth daily.     No current facility-administered medications for this visit.     Allergies (verified) Penicillins   PAST HISTORY  Family History No family history on file.  Social History History  Substance Use Topics  . Smoking status: Never Smoker   .  Smokeless tobacco: Not on file  . Alcohol Use: Not on file    Are there smokers in your home (other than you)?  No  Risk Factors Current exercise habits: Home exercise routine includes stretching.  Dietary issues discussed: DASH diet   Cardiac risk factors: advanced age (older than 26 for men, 20 for women), hypertension and male gender.  Depression Screen (Note: if answer to either of the following is "Yes", a more complete depression screening is indicated)   Q1: Over the past two weeks, have you felt down, depressed or hopeless? No  Q2:  Over the past two weeks, have you felt little interest or pleasure in doing things? No  Have you lost interest or pleasure in daily life? No  Do you often feel hopeless? No  Do you cry easily over simple problems? No  Activities of Daily Living In your present state of health, do you have any difficulty performing the following activities?:  Driving? No Managing money?  No Feeding yourself? No Getting from bed to chair? No Climbing a flight of stairs? No Preparing food and eating?: No Bathing or showering? No Getting dressed: No Getting to the toilet? No Using the toilet:No Moving around from place to place: No In the past year have you fallen or had a near fall?:No   Are you sexually active?  Yes  Do you have more than one partner?  No  Hearing Difficulties: No Do you often ask people to speak up or repeat themselves? No Do you experience ringing or noises in your ears? No Do you have difficulty understanding soft or whispered voices? No   Do you feel that you have a problem with memory? No  Do you often misplace items? No  Do you feel safe at home?  No  Cognitive Testing  Alert? Yes  Normal Appearance?Yes  Oriented to person? Yes  Place? Yes   Time? Yes  Recall of three objects?  Yes  Can perform simple calculations? Yes  Displays appropriate judgment?Yes  Can read the correct time from a watch face?Yes   Advanced Directives have been discussed with the patient? Yes   List the Names of Other Physician/Practitioners you currently use: 1.  none  Indicate any recent Medical Services you may have received from other than [facility] providers in the past year (date may be approximate).  Immunization History  Administered Date(s) Administered  . Influenza Whole 09/08/2005  . Pneumococcal Polysaccharide-23 02/15/2013    Screening Tests Health Maintenance  Topic Date Due  . TETANUS/TDAP  09/19/1966  . ZOSTAVAX  09/20/2007  . INFLUENZA VACCINE  07/09/2014  .  COLONOSCOPY  04/17/2023  . PNEUMOCOCCAL POLYSACCHARIDE VACCINE AGE 48 AND OVER  Completed    All answers were reviewed with the patient and necessary referrals were made:  Laren Boom, DO   01/27/2015   History reviewed: allergies, current medications, past family history, past medical history, past social history, past surgical history and problem list  Review of Systems Review of Systems - General ROS: negative for - chills, fever, night sweats, weight gain or weight loss Ophthalmic ROS: negative for - decreased vision Psychological ROS: negative for - anxiety or depression ENT ROS: negative for - hearing change, nasal congestion, tinnitus or allergies Hematological and Lymphatic ROS: negative for - bleeding problems, bruising or swollen lymph nodes Breast ROS: negative Respiratory ROS: no cough, shortness of breath, or wheezing Cardiovascular ROS: no chest pain or dyspnea on exertion Gastrointestinal ROS: no abdominal pain, change in  bowel habits, or black or bloody stools Genito-Urinary ROS: negative for - genital discharge, genital ulcers, incontinence or abnormal bleeding from genitals Musculoskeletal ROS: negative for - joint pain or muscle pain Neurological ROS: negative for -  memory loss Dermatological ROS: negative for lumps, mole changes, rash and skin lesion changes other than that described above   Objective:     Vision by Snellen chart: right eye:20/25, left eye:20/40 Blood pressure 147/89, pulse 67, height  (1.778 m), weight 159 lb (72.122 kg). Body mass index is 22.81 kg/(m^2).  General: No Acute Distress HEENT: Atraumatic, normocephalic, conjunctivae normal without scleral icterus.  No nasal discharge, hearing grossly intact, TMs with good landmarks bilaterally with no middle ear abnormalities, posterior pharynx clear without oral lesions. Neck: Supple, trachea midline, no cervical nor supraclavicular adenopathy. Pulmonary: Clear to auscultation bilaterally  without wheezing, rhonchi, nor rales. Cardiac: Regular rate and rhythm.  No murmurs, rubs, nor gallops. No peripheral edema.  2+ peripheral pulses bilaterally. Abdomen: Bowel sounds normal.  No masses.  Non-tender without rebound.  Negative Murphy's sign. MSK: Grossly intact, no signs of weakness.  Full strength throughout upper and lower extremities.  Full ROM in upper and lower extremities.  No midline spinal tenderness. Neuro: Gait unremarkable, CN II-XII grossly intact.  C5-C6 Reflex 2/4 Bilaterally, L4 Reflex 2/4 Bilaterally.  Cerebellar function intact. Skin: No rashes. 2 cm diameter flat pigmented lesion medial surface of the left third toe Psych: Alert and oriented to person/place/time.  Thought process normal. No anxiety/depression.     Assessment:     Pigmented skin lesion, headaches, uncontrolled hypertension     Plan:     During the course of the visit the patient was educated and counseled about appropriate screening and preventive services including:    Viagra for erectile dysfunction, referral to dermatology for further evaluation of pigmented skin lesion. Encouraged to see eye doctor at his convenience for corrective lenses  Diet review for nutrition referral? no   Patient Instructions (the written plan) was given to the patient.  Medicare Attestation I have personally reviewed: The patient's medical and social history Their use of alcohol, tobacco or illicit drugs Their current medications and supplements The patient's functional ability including ADLs,fall risks, home safety risks, cognitive, and hearing and visual impairment Diet and physical activities Evidence for depression or mood disorders  The patient's weight, height, BMI, and visual acuity have been recorded in the chart.  I have made referrals, counseling, and provided education to the patient based on review of the above and I have provided the patient with a written personalized care plan for  preventive services.     Laren Boom, DO   01/27/2015

## 2015-01-28 LAB — PSA: PSA: 1.11 ng/mL (ref <=4.00)

## 2015-01-30 ENCOUNTER — Telehealth: Payer: Self-pay | Admitting: Family Medicine

## 2015-01-30 DIAGNOSIS — E785 Hyperlipidemia, unspecified: Secondary | ICD-10-CM

## 2015-01-30 MED ORDER — ATORVASTATIN CALCIUM 20 MG PO TABS: 20.0000 mg | ORAL_TABLET | Freq: Every day | ORAL | Status: DC

## 2015-01-30 NOTE — Telephone Encounter (Signed)
Sue Lushndrea, Will you please let patient know that his PSA prostate test, kidney function, liver function, and blood cell counts were normal.  His cholesterol is elevated to a degree that I'd recommend he start on a cholesterol loweing medication called atorvastatin, I've sent an Rx to his [facility] pharmacy.  Blood sugar was just barely elevated but not in the diabetic range.  This can be improved with engaging in 30-45 minutes of moderate exercise most days of the week. F/U 3 months.

## 2015-01-30 NOTE — Telephone Encounter (Signed)
Pt.notified

## 2015-02-23 DIAGNOSIS — H402213 Chronic angle-closure glaucoma, right eye, severe stage: Secondary | ICD-10-CM | POA: Diagnosis not present

## 2015-02-23 DIAGNOSIS — H2512 Age-related nuclear cataract, left eye: Secondary | ICD-10-CM | POA: Diagnosis not present

## 2015-02-23 DIAGNOSIS — H269 Unspecified cataract: Secondary | ICD-10-CM | POA: Diagnosis not present

## 2015-02-23 DIAGNOSIS — H402223 Chronic angle-closure glaucoma, left eye, severe stage: Secondary | ICD-10-CM | POA: Diagnosis not present

## 2015-03-09 DIAGNOSIS — H2512 Age-related nuclear cataract, left eye: Secondary | ICD-10-CM | POA: Insufficient documentation

## 2015-03-09 DIAGNOSIS — H25012 Cortical age-related cataract, left eye: Secondary | ICD-10-CM | POA: Insufficient documentation

## 2015-03-13 DIAGNOSIS — Z9841 Cataract extraction status, right eye: Secondary | ICD-10-CM | POA: Diagnosis not present

## 2015-03-13 DIAGNOSIS — Z880 Allergy status to penicillin: Secondary | ICD-10-CM | POA: Diagnosis not present

## 2015-03-13 DIAGNOSIS — Z792 Long term (current) use of antibiotics: Secondary | ICD-10-CM | POA: Diagnosis not present

## 2015-03-13 DIAGNOSIS — H2512 Age-related nuclear cataract, left eye: Secondary | ICD-10-CM | POA: Diagnosis not present

## 2015-03-13 DIAGNOSIS — H5703 Miosis: Secondary | ICD-10-CM | POA: Diagnosis not present

## 2015-03-13 DIAGNOSIS — Z7982 Long term (current) use of aspirin: Secondary | ICD-10-CM | POA: Diagnosis not present

## 2015-03-13 DIAGNOSIS — H402233 Chronic angle-closure glaucoma, bilateral, severe stage: Secondary | ICD-10-CM | POA: Diagnosis not present

## 2015-03-13 DIAGNOSIS — H402290 Chronic angle-closure glaucoma, unspecified eye, stage unspecified: Secondary | ICD-10-CM | POA: Insufficient documentation

## 2015-03-13 DIAGNOSIS — H25012 Cortical age-related cataract, left eye: Secondary | ICD-10-CM | POA: Diagnosis not present

## 2015-03-13 DIAGNOSIS — Z79899 Other long term (current) drug therapy: Secondary | ICD-10-CM | POA: Diagnosis not present

## 2015-03-13 DIAGNOSIS — Z961 Presence of intraocular lens: Secondary | ICD-10-CM | POA: Diagnosis not present

## 2015-03-14 DIAGNOSIS — Z961 Presence of intraocular lens: Secondary | ICD-10-CM | POA: Insufficient documentation

## 2015-03-17 ENCOUNTER — Telehealth: Payer: Self-pay | Admitting: Family Medicine

## 2015-03-17 DIAGNOSIS — I10 Essential (primary) hypertension: Secondary | ICD-10-CM

## 2015-03-17 MED ORDER — PROPRANOLOL HCL 80 MG PO TABS: 80.0000 mg | ORAL_TABLET | Freq: Two times a day (BID) | ORAL | Status: DC

## 2015-03-17 MED ORDER — ATORVASTATIN CALCIUM 20 MG PO TABS: 20.0000 mg | ORAL_TABLET | Freq: Every day | ORAL | Status: DC

## 2015-03-17 NOTE — Telephone Encounter (Signed)
[facility]

## 2015-04-28 ENCOUNTER — Ambulatory Visit: Payer: Medicare Other | Admitting: Family Medicine

## 2015-07-24 ENCOUNTER — Encounter: Payer: Self-pay | Admitting: Family Medicine

## 2015-07-24 ENCOUNTER — Ambulatory Visit (INDEPENDENT_AMBULATORY_CARE_PROVIDER_SITE_OTHER): Payer: Commercial Managed Care - HMO | Admitting: Family Medicine

## 2015-07-24 VITALS — BP 141/78 | HR 63 | Ht 70.0 in | Wt 155.0 lb

## 2015-07-24 DIAGNOSIS — Z7184 Encounter for health counseling related to travel: Secondary | ICD-10-CM

## 2015-07-24 DIAGNOSIS — Z7189 Other specified counseling: Secondary | ICD-10-CM | POA: Diagnosis not present

## 2015-07-24 MED ORDER — TYPHOID VACCINE PO CPDR: 1.0000 | DELAYED_RELEASE_CAPSULE | ORAL | Status: DC

## 2015-07-24 MED ORDER — ATOVAQUONE-PROGUANIL HCL 250-100 MG PO TABS: 1.0000 | ORAL_TABLET | Freq: Every day | ORAL | Status: DC

## 2015-07-24 NOTE — Progress Notes (Addendum)
CC: Angel Krueger is a 69 y.o. male is here for Follow-up   Subjective: HPI:  Patient will be traveling to [place] in regions known to have malaria, he'll be leaving in September. He will be there for 2 weeks. He wants to know if there is any medication he needs to take to prevent communicable disease is overseas. He's never had the polio vaccine. He is up-to-date on hepatitis vaccines. He's not had the flu shot yet. In October he will then be going to [place] for 4 months. He will be in regions that are known to have malaria.he will not be traveling to anywhere with Japanese encephalitis that he knows of. He will not be traveling to any regions known to have yellow fever.   Review Of Systems Outlined In HPI  Past Medical History  Diagnosis Date  . Generalized headaches     Past Surgical History  Procedure Laterality Date  . Colon surgery      Unknown specifics   No family history on file.  Social History   Social History  . Marital Status: Married    Spouse Name: N/A  . Number of Children: N/A  . Years of Education: N/A   Occupational History  . Not on file.   Social History Main Topics  . Smoking status: Never Smoker   . Smokeless tobacco: Not on file  . Alcohol Use: Not on file  . Drug Use: No  . Sexual Activity: Not on file   Other Topics Concern  . Not on file   Social History Narrative     Objective: BP 141/78 mmHg  Pulse 63  Ht  (1.778 m)  Wt 155 lb (70.308 kg)  BMI 22.24 kg/m2  Vital signs reviewed. General: Alert and Oriented, No Acute Distress HEENT: Pupils equal, round, reactive to light. Conjunctivae clear.  External ears unremarkable.  Moist mucous membranes. Lungs: Clear and comfortable work of breathing, speaking in full sentences without accessory muscle use. Cardiac: Regular rate and rhythm.  Neuro: CN II-XII grossly intact, gait normal. Extremities: No peripheral edema.  Strong peripheral pulses.  Mental Status: No depression, anxiety,  nor agitation. Logical though process. Skin: Warm and dry.  Assessment & Plan: [name] was seen today for follow-up.  Diagnoses and all orders for this visit:  Travel advice encounter -     typhoid (VIVOTIF) DR capsule; Take 1 capsule by mouth every other day. For total doses. Complete greater than one week before travel. -     atovaquone-proguanil (MALARONE) 250-100 MG TABS; Take 1 tablet by mouth daily. One by mouth daily, begin the day before travel, stop taking one week after return from [place] or [place].   Will receive typhoid vaccine by mouth, advised to ensure that this is completed at least 1 week prior to leaving for [place]. Also start malarone 1-2 days prior to departure and he can stop this one week after he returns from [place] but will need to restart one to 2 days prior to departure to [place], again he can stop it one week after returning from [place].  Return if symptoms worsen or fail to improve. 15 minutes spent face-to-face during visit today of which at least 50% was counseling or coordinating care regarding: 1. Travel advice encounter

## 2015-07-28 DIAGNOSIS — Z961 Presence of intraocular lens: Secondary | ICD-10-CM | POA: Diagnosis not present

## 2015-07-28 DIAGNOSIS — H402222 Chronic angle-closure glaucoma, left eye, moderate stage: Secondary | ICD-10-CM | POA: Diagnosis not present

## 2015-07-28 DIAGNOSIS — H402223 Chronic angle-closure glaucoma, left eye, severe stage: Secondary | ICD-10-CM | POA: Diagnosis not present

## 2015-07-28 DIAGNOSIS — H402213 Chronic angle-closure glaucoma, right eye, severe stage: Secondary | ICD-10-CM | POA: Diagnosis not present

## 2015-08-01 ENCOUNTER — Telehealth: Payer: Self-pay | Admitting: Family Medicine

## 2015-08-01 MED ORDER — DOXYCYCLINE HYCLATE 100 MG PO TABS
ORAL_TABLET | ORAL | Status: DC
Start: 2015-08-01 — End: 2016-09-09

## 2015-08-01 NOTE — Telephone Encounter (Signed)
Sue Lush, Will you please let patient know that I got a request from  [facility] to switch his malaria medication to chloroquine.  Unfortunately malaria is resistant to this medication in [place] and [place].  A cheaper alternative would be to start doxycycline instead. I'm sending this to his [place] mail order pharmacy.

## 2015-08-02 NOTE — Telephone Encounter (Signed)
Pt's wife notified.

## 2016-01-31 DIAGNOSIS — Z961 Presence of intraocular lens: Secondary | ICD-10-CM | POA: Diagnosis not present

## 2016-01-31 DIAGNOSIS — H402213 Chronic angle-closure glaucoma, right eye, severe stage: Secondary | ICD-10-CM | POA: Diagnosis not present

## 2016-01-31 DIAGNOSIS — H5703 Miosis: Secondary | ICD-10-CM | POA: Diagnosis not present

## 2016-01-31 DIAGNOSIS — H402222 Chronic angle-closure glaucoma, left eye, moderate stage: Secondary | ICD-10-CM | POA: Diagnosis not present

## 2016-02-29 ENCOUNTER — Ambulatory Visit (INDEPENDENT_AMBULATORY_CARE_PROVIDER_SITE_OTHER): Payer: Commercial Managed Care - HMO | Admitting: Family Medicine

## 2016-02-29 ENCOUNTER — Encounter: Payer: Self-pay | Admitting: Family Medicine

## 2016-02-29 VITALS — BP 148/83 | HR 63 | Wt 155.0 lb

## 2016-02-29 DIAGNOSIS — Z0000 Encounter for general adult medical examination without abnormal findings: Secondary | ICD-10-CM | POA: Diagnosis not present

## 2016-02-29 DIAGNOSIS — Z125 Encounter for screening for malignant neoplasm of prostate: Secondary | ICD-10-CM | POA: Diagnosis not present

## 2016-02-29 DIAGNOSIS — E785 Hyperlipidemia, unspecified: Secondary | ICD-10-CM

## 2016-02-29 DIAGNOSIS — R05 Cough: Secondary | ICD-10-CM

## 2016-02-29 DIAGNOSIS — IMO0001 Reserved for inherently not codable concepts without codable children: Secondary | ICD-10-CM

## 2016-02-29 DIAGNOSIS — R059 Cough, unspecified: Secondary | ICD-10-CM

## 2016-02-29 DIAGNOSIS — R030 Elevated blood-pressure reading, without diagnosis of hypertension: Secondary | ICD-10-CM

## 2016-02-29 DIAGNOSIS — K635 Polyp of colon: Secondary | ICD-10-CM

## 2016-02-29 DIAGNOSIS — Z13220 Encounter for screening for lipoid disorders: Secondary | ICD-10-CM | POA: Diagnosis not present

## 2016-02-29 DIAGNOSIS — I10 Essential (primary) hypertension: Secondary | ICD-10-CM | POA: Diagnosis not present

## 2016-02-29 DIAGNOSIS — L989 Disorder of the skin and subcutaneous tissue, unspecified: Secondary | ICD-10-CM

## 2016-02-29 LAB — CBC
HCT: 47.5 % (ref 39.0–52.0)
HEMOGLOBIN: 16.5 g/dL (ref 13.0–17.0)
MCH: 30.7 pg (ref 26.0–34.0)
MCHC: 34.7 g/dL (ref 30.0–36.0)
MCV: 88.5 fL (ref 78.0–100.0)
MPV: 11 fL (ref 8.6–12.4)
Platelets: 150 10*3/uL (ref 150–400)
RBC: 5.37 MIL/uL (ref 4.22–5.81)
RDW: 13.8 % (ref 11.5–15.5)
WBC: 6 10*3/uL (ref 4.0–10.5)

## 2016-02-29 LAB — COMPLETE METABOLIC PANEL WITH GFR
ALBUMIN: 3.8 g/dL (ref 3.6–5.1)
ALK PHOS: 56 U/L (ref 40–115)
ALT: 12 U/L (ref 9–46)
AST: 18 U/L (ref 10–35)
BILIRUBIN TOTAL: 0.7 mg/dL (ref 0.2–1.2)
BUN: 11 mg/dL (ref 7–25)
CO2: 27 mmol/L (ref 20–31)
Calcium: 8.9 mg/dL (ref 8.6–10.3)
Chloride: 106 mmol/L (ref 98–110)
Creat: 0.88 mg/dL (ref 0.70–1.25)
GFR, EST NON AFRICAN AMERICAN: 88 mL/min (ref >=60)
GFR, Est African American: >89 mL/min (ref >=60)
GLUCOSE: 106 mg/dL — AB (ref 65–99)
POTASSIUM: 4.6 mmol/L (ref 3.5–5.3)
SODIUM: 140 mmol/L (ref 135–146)
TOTAL PROTEIN: 7.3 g/dL (ref 6.1–8.1)

## 2016-02-29 LAB — LIPID PANEL
CHOL/HDL RATIO: 3.9 ratio (ref <=5.0)
Cholesterol: 180 mg/dL (ref 125–200)
HDL: 46 mg/dL (ref >=40)
LDL CALC: 112 mg/dL (ref <130)
Triglycerides: 109 mg/dL (ref <150)
VLDL: 22 mg/dL (ref <30)

## 2016-02-29 MED ORDER — METHYLPREDNISOLONE ACETATE 80 MG/ML IJ SUSP
80.0000 mg | Freq: Once | INTRAMUSCULAR | Status: AC
  Administered 2016-02-29: 80 mg via INTRAMUSCULAR

## 2016-02-29 MED ORDER — PROPRANOLOL HCL 80 MG PO TABS: 80.0000 mg | ORAL_TABLET | Freq: Two times a day (BID) | ORAL | Status: DC

## 2016-02-29 NOTE — Patient Instructions (Signed)
Dr. Mozetta Murfin's General Advice Following Your Complete Physical Exam  The Benefits of Regular Exercise: Unless you suffer from an uncontrolled cardiovascular condition, studies strongly suggest that regular exercise and physical activity will add to both the quality and length of your life.  The World Health Organization recommends 150 minutes of moderate intensity aerobic activity every week.  This is best split over 3-4 days a week, and can be as simple as a brisk walk for just over 35 minutes "most days of the week".  This type of exercise has been shown to lower LDL-Cholesterol, lower average blood sugars, lower blood pressure, lower cardiovascular disease risk, improve memory, and increase one's overall sense of wellbeing.  The addition of anaerobic (or "strength training") exercises offers additional benefits including but not limited to increased metabolism, prevention of osteoporosis, and improved overall cholesterol levels.  How Can I Strive For A Low-Fat Diet?: Current guidelines recommend that 25-35 percent of your daily energy (food) intake should come from fats.  One might ask how can this be achieved without having to dissect each meal on a daily basis?  Switch to skim or 1% milk instead of whole milk.  Focus on lean meats such as ground turkey, fresh fish, baked chicken, and lean cuts of beef as your source of dietary protein.  Limit saturated fat consumption to less than 10% of your daily caloric intake.  Limit trans fatty acid consumption primarily by limiting synthetic trans fats such as partially hydrogenated oils (Ex: fried fast foods).  Substitute olive or vegetable oil for solid fats where possible.  Moderation of Salt Intake: Provided you don't carry a diagnosis of congestive heart failure nor renal failure, I recommend a daily allowance of no more than 2300 mg of salt (sodium).  Keeping under this daily goal is associated with a decreased risk of cardiovascular events, creeping  above it can lead to elevated blood pressures and increases your risk of cardiovascular events.  Milligrams (mg) of salt is listed on all nutrition labels, and your daily intake can add up faster than you think.  Most canned and frozen dinners can pack in over half your daily salt allowance in one meal.    Lifestyle Health Risks: Certain lifestyle choices carry specific health risks.  As you may already know, tobacco use has been associated with increasing one's risk of cardiovascular disease, pulmonary disease, numerous cancers, among many other issues.  What you may not know is that there are medications and nicotine replacement strategies that can more than double your chances of successfully quitting.  I would be thrilled to help manage your quitting strategy if you currently use tobacco products.  When it comes to alcohol use, I've yet to find an "ideal" daily allowance.  Provided an individual does not have a medical condition that is exacerbated by alcohol consumption, general guidelines determine "safe drinking" as no more than two standard drinks for a man or no more than one standard drink for a male per day.  However, much debate still exists on whether any amount of alcohol consumption is technically "safe".  My general advice, keep alcohol consumption to a minimum for general health promotion.  If you or others believe that alcohol, tobacco, or recreational drug use is interfering with your life, I would be happy to provide confidential counseling regarding treatment options.  General "Over The Counter" Nutrition Advice: Postmenopausal women should aim for a daily calcium intake of 1200 mg, however a significant portion of this might already be   provided by diets including milk, yogurt, cheese, and other dairy products.  Vitamin D has been shown to help preserve bone density, prevent fatigue, and has even been shown to help reduce falls in the elderly.  Ensuring a daily intake of 800 Units of  Vitamin D is a good place to start to enjoy the above benefits, we can easily check your Vitamin D level to see if you'd potentially benefit from supplementation beyond 800 Units a day.  Folic Acid intake should be of particular concern to women of childbearing age.  Daily consumption of 400-800 mcg of Folic Acid is recommended to minimize the chance of spinal cord defects in a fetus should pregnancy occur.    For many adults, accidents still remain one of the most common culprits when it comes to cause of death.  Some of the simplest but most effective preventitive habits you can adopt include regular seatbelt use, proper helmet use, securing firearms, and regularly testing your smoke and carbon monoxide detectors.  Angel Krueger B. Angel Kovack DO [facility] [address], [address] [place], [place] [address] Phone: [phone]  

## 2016-02-29 NOTE — Progress Notes (Signed)
Subjective:    Angel Krueger is a 70 y.o. male who presents for Medicare Annual/Subsequent preventive examination.   Preventive Screening-Counseling & Management  Tobacco History  Smoking status  . Never Smoker   Smokeless tobacco  . Not on file   Colonoscopy: UTD repeat needed 2024 Prostate: Discussed screening risks/beneifts with patient today, obtaining PSA today  Influenza Vaccine: UTD Pneumovax: UTD Td/Tdap: UTD Zoster: UTD   Problems Prior to Visit 1. Migraines - stable with propanol   Current Problems (verified) Patient Active Problem List   Diagnosis Date Noted  . Hyperplastic colon polyp 04/20/2013  . Elevated BP 03/15/2013  . Hyperlipidemia with target LDL less than 130 02/16/2013  . History of migraine 02/15/2013  . INFLUENZA 01/10/2010    Medications Prior to Visit Current Outpatient Prescriptions on File Prior to Visit  Medication Sig Dispense Refill  . aspirin EC 81 MG tablet Take 1 tablet (81 mg total) by mouth daily. 150 tablet 2  . atorvastatin (LIPITOR) 20 MG tablet Take 1 tablet (20 mg total) by mouth daily. 90 tablet 3  . BRIMONIDINE TARTRATE OP Apply to eye.    . dorzolamide (TRUSOPT) 2 % ophthalmic solution 1 drop 3 (three) times daily.    Marland Kitchen doxycycline (VIBRA-TABS) 100 MG tablet One by mouth daily.  Begin one day before malaria exposure and for four weeks after exposure in [place] or [place]. 192 tablet 0  . frovatriptan (FROVA) 2.5 MG tablet Take 1 tablet (2.5 mg total) by mouth as needed for migraine. If recurs, may repeat after 2 hours. Max of 3 tabs in 24 hours. 10 tablet 3  . ibuprofen (ADVIL,MOTRIN) 800 MG tablet Take 1 tablet (800 mg total) by mouth every 8 (eight) hours as needed. 30 tablet 0  . LATANOPROST OP Apply to eye.    . propranolol (INDERAL) 80 MG tablet Take 1 tablet (80 mg total) by mouth 2 (two) times daily. 180 tablet 3  . sildenafil (VIAGRA) 100 MG tablet Take 0.5-1 tablets (50-100 mg total) by mouth daily as needed for  erectile dysfunction. 5 tablet 11  . typhoid (VIVOTIF) DR capsule Take 1 capsule by mouth every other day. For total doses. Complete greater than one week before travel. 4 capsule 0  . vitamin B-12 (CYANOCOBALAMIN) 1000 MCG tablet Take 1,000 mcg by mouth daily.    Marland Kitchen zoster vaccine live, PF, (ZOSTAVAX) 95284 UNT/0.65ML injection Inject 19,400 Units into the skin once. 1 each 0   No current facility-administered medications on file prior to visit.    Current Medications (verified) Current Outpatient Prescriptions  Medication Sig Dispense Refill  . aspirin EC 81 MG tablet Take 1 tablet (81 mg total) by mouth daily. 150 tablet 2  . atorvastatin (LIPITOR) 20 MG tablet Take 1 tablet (20 mg total) by mouth daily. 90 tablet 3  . BRIMONIDINE TARTRATE OP Apply to eye.    . dorzolamide (TRUSOPT) 2 % ophthalmic solution 1 drop 3 (three) times daily.    Marland Kitchen doxycycline (VIBRA-TABS) 100 MG tablet One by mouth daily.  Begin one day before malaria exposure and for four weeks after exposure in [place] or [place]. 192 tablet 0  . frovatriptan (FROVA) 2.5 MG tablet Take 1 tablet (2.5 mg total) by mouth as needed for migraine. If recurs, may repeat after 2 hours. Max of 3 tabs in 24 hours. 10 tablet 3  . ibuprofen (ADVIL,MOTRIN) 800 MG tablet Take 1 tablet (800 mg total) by mouth every 8 (eight) hours as needed. 30 tablet  0  . LATANOPROST OP Apply to eye.    . propranolol (INDERAL) 80 MG tablet Take 1 tablet (80 mg total) by mouth 2 (two) times daily. 180 tablet 3  . sildenafil (VIAGRA) 100 MG tablet Take 0.5-1 tablets (50-100 mg total) by mouth daily as needed for erectile dysfunction. 5 tablet 11  . typhoid (VIVOTIF) DR capsule Take 1 capsule by mouth every other day. For total doses. Complete greater than one week before travel. 4 capsule 0  . vitamin B-12 (CYANOCOBALAMIN) 1000 MCG tablet Take 1,000 mcg by mouth daily.    Marland Kitchen zoster vaccine live, PF, (ZOSTAVAX) 40981 UNT/0.65ML injection Inject 19,400 Units into the  skin once. 1 each 0   No current facility-administered medications for this visit.     Allergies (verified) Penicillins   PAST HISTORY  Family History No family history on file.  Social History Social History  Substance Use Topics  . Smoking status: Never Smoker   . Smokeless tobacco: Not on file  . Alcohol Use: Not on file    Are there smokers in your home (other than you)?  No  Risk Factors Current exercise habits: The patient does not participate in regular exercise at present.  Dietary issues discussed: DASH   Cardiac risk factors: advanced age (older than 49 for men, 64 for women), dyslipidemia and hypertension.  Depression Screen (Note: if answer to either of the following is "Yes", a more complete depression screening is indicated)   Q1: Over the past two weeks, have you felt down, depressed or hopeless? No  Q2: Over the past two weeks, have you felt little interest or pleasure in doing things? No  Have you lost interest or pleasure in daily life? No  Do you often feel hopeless? No  Do you cry easily over simple problems? No  Activities of Daily Living In your present state of health, do you have any difficulty performing the following activities?:  Driving? No Managing money?  No Feeding yourself? No Getting from bed to chair? No Climbing a flight of stairs? No Preparing food and eating?: No Bathing or showering? No Getting dressed: No Getting to the toilet? No Using the toilet:No Moving around from place to place: No In the past year have you fallen or had a near fall?:No   Are you sexually active?  No  Do you have more than one partner?  No  Hearing Difficulties: No Do you often ask people to speak up or repeat themselves? No Do you experience ringing or noises in your ears? No Do you have difficulty understanding soft or whispered voices? No   Do you feel that you have a problem with memory? No  Do you often misplace items? No  Do you feel safe  at home?  Yes  Cognitive Testing  Alert? Yes  Normal Appearance?Yes  Oriented to person? Yes Place? Yes   Time? Yes  Recall of three objects?  Yes  Can perform simple calculations? Yes  Displays appropriate judgment?Yes  Can read the correct time from a watch face?Yes   Advanced Directives have been discussed with the patient? Yes   List the Names of Other Physician/Practitioners you currently use: 1.    Indicate any recent Medical Services you may have received from other than [facility] providers in the past year (date may be approximate).  Immunization History  Administered Date(s) Administered  . Influenza Whole 09/08/2005  . Influenza-Unspecified 08/01/2015  . Pneumococcal Conjugate-13 01/27/2015  . Pneumococcal Polysaccharide-23 02/15/2013  .  Zoster 08/01/2015    Screening Tests Health Maintenance  Topic Date Due  . Hepatitis C Screening  12-16-1945  . TETANUS/TDAP  09/19/1966  . INFLUENZA VACCINE  07/09/2016  . COLONOSCOPY  04/17/2023  . ZOSTAVAX  Completed  . PNA vac Low Risk Adult  Completed    All answers were reviewed with the patient and necessary referrals were made:  Laren Boom, DO   02/29/2016   History reviewed: allergies, current medications, past family history, past medical history, past social history, past surgical history and problem list  Review of Systems Review of Systems - General ROS: negative for - chills, fever, night sweats, weight gain or weight loss Ophthalmic ROS: negative for - decreased vision Psychological ROS: negative for - anxiety or depression ENT ROS: negative for - hearing change, nasal congestion, tinnitus or allergies Hematological and Lymphatic ROS: negative for - bleeding problems, bruising or swollen lymph nodes Breast ROS: negative Respiratory ROS: no shortness of breath, or wheezing, positive for cough for the last 3 weeks which is nonproductive Cardiovascular ROS: no chest pain or dyspnea on exertion Gastrointestinal  ROS: no abdominal pain, change in bowel habits, or black or bloody stools Genito-Urinary ROS: negative for - genital discharge, genital ulcers, incontinence or abnormal bleeding from genitals Musculoskeletal ROS: negative for - joint pain or muscle pain Neurological ROS: negative for - headaches or memory loss Dermatological ROS: negative for lumps, mole changes, rash and skin lesion changes other than a spot on his right forehead that came up a few months ago. It's painless   Objective:     Vision by Snellen chart: right eye:20/30, left eye:20/25 Blood pressure 148/83, pulse 63, weight 155 lb (70.308 kg). Body mass index is 22.24 kg/(m^2).  General: No Acute Distress HEENT: Atraumatic, normocephalic, conjunctivae normal without scleral icterus.  No nasal discharge, hearing grossly intact, TMs with good landmarks bilaterally with no middle ear abnormalities, posterior pharynx clear without oral lesions. Neck: Supple, trachea midline, no cervical nor supraclavicular adenopathy. Pulmonary: Clear to auscultation bilaterally without wheezing, rhonchi, nor rales. Cardiac: Regular rate and rhythm.  No murmurs, rubs, nor gallops. No peripheral edema.  2+ peripheral pulses bilaterally. Abdomen: Bowel sounds normal.  No masses.  Non-tender without rebound.  Negative Murphy's sign. MSK: Grossly intact, no signs of weakness.  Full strength throughout upper and lower extremities.  Full ROM in upper and lower extremities.  No midline spinal tenderness. Neuro: Gait unremarkable, CN II-XII grossly intact.  C5-C6 Reflex 2/4 Bilaterally, L4 Reflex 2/4 Bilaterally.  Cerebellar function intact. Skin: No rashes. Flesh-colored papule on the right forehead Psych: Alert and oriented to person/place/time.  Thought process normal. No anxiety/depression.     Assessment:     Skin lesion on the right forehead felt to be sebaceous hyperplasia, attention to this on future visits to ensure it's not actually basal cell  carcinoma.  Cough - Depomedrol today, low suspicion of infection      Plan:     During the course of the visit the patient was educated and counseled about appropriate screening and preventive services including:    Td vaccine  Prostate cancer screening  Colorectal cancer screening  Diabetes screening  Nutrition counseling   Diet review for nutrition referral? Not Indicated ____   Patient Instructions (the written plan) was given to the patient.  Medicare Attestation I have personally reviewed: The patient's medical and social history Their use of alcohol, tobacco or illicit drugs Their current medications and supplements The patient's functional ability including  ADLs,fall risks, home safety risks, cognitive, and hearing and visual impairment Diet and physical activities Evidence for depression or mood disorders  The patient's weight, height, BMI, and visual acuity have been recorded in the chart.  I have made referrals, counseling, and provided education to the patient based on review of the above and I have provided the patient with a written personalized care plan for preventive services.     Laren BoomHommel, Estefano Victory, DO   02/29/2016

## 2016-03-01 ENCOUNTER — Telehealth: Payer: Self-pay | Admitting: Family Medicine

## 2016-03-01 LAB — PSA: PSA: 1.24 ng/mL (ref <=4.00)

## 2016-03-01 NOTE — Telephone Encounter (Signed)
Will you please let patient know that his kidney function, liver function, cholesterol, PSA prostate test and blood cell counts were normal.  His blood sugar was mildly elevated but not in the diabetic range and similar to values over the past year.  I'd recommend he have this rechecked in one year.

## 2016-03-01 NOTE — Telephone Encounter (Signed)
Pt.notified

## 2016-04-30 DIAGNOSIS — H5703 Miosis: Secondary | ICD-10-CM | POA: Diagnosis not present

## 2016-04-30 DIAGNOSIS — H402222 Chronic angle-closure glaucoma, left eye, moderate stage: Secondary | ICD-10-CM | POA: Diagnosis not present

## 2016-04-30 DIAGNOSIS — H402213 Chronic angle-closure glaucoma, right eye, severe stage: Secondary | ICD-10-CM | POA: Diagnosis not present

## 2016-04-30 DIAGNOSIS — Z961 Presence of intraocular lens: Secondary | ICD-10-CM | POA: Diagnosis not present

## 2016-05-02 DIAGNOSIS — H402213 Chronic angle-closure glaucoma, right eye, severe stage: Secondary | ICD-10-CM | POA: Diagnosis not present

## 2016-06-26 DIAGNOSIS — H402213 Chronic angle-closure glaucoma, right eye, severe stage: Secondary | ICD-10-CM | POA: Diagnosis not present

## 2016-06-26 DIAGNOSIS — Z961 Presence of intraocular lens: Secondary | ICD-10-CM | POA: Diagnosis not present

## 2016-06-26 DIAGNOSIS — H402222 Chronic angle-closure glaucoma, left eye, moderate stage: Secondary | ICD-10-CM | POA: Diagnosis not present

## 2016-09-02 ENCOUNTER — Ambulatory Visit: Payer: Medicare Other | Admitting: Family Medicine

## 2016-09-09 ENCOUNTER — Ambulatory Visit (INDEPENDENT_AMBULATORY_CARE_PROVIDER_SITE_OTHER): Payer: Commercial Managed Care - HMO | Admitting: Family Medicine

## 2016-09-09 ENCOUNTER — Ambulatory Visit: Payer: Self-pay | Admitting: Family Medicine

## 2016-09-09 ENCOUNTER — Encounter: Payer: Self-pay | Admitting: Family Medicine

## 2016-09-09 VITALS — BP 146/88 | HR 66 | Wt 156.0 lb

## 2016-09-09 DIAGNOSIS — Z7184 Encounter for health counseling related to travel: Secondary | ICD-10-CM

## 2016-09-09 DIAGNOSIS — Z1159 Encounter for screening for other viral diseases: Secondary | ICD-10-CM

## 2016-09-09 DIAGNOSIS — K5900 Constipation, unspecified: Secondary | ICD-10-CM

## 2016-09-09 DIAGNOSIS — E785 Hyperlipidemia, unspecified: Secondary | ICD-10-CM | POA: Diagnosis not present

## 2016-09-09 DIAGNOSIS — Z7189 Other specified counseling: Secondary | ICD-10-CM | POA: Diagnosis not present

## 2016-09-09 DIAGNOSIS — I10 Essential (primary) hypertension: Secondary | ICD-10-CM

## 2016-09-09 DIAGNOSIS — Z23 Encounter for immunization: Secondary | ICD-10-CM | POA: Diagnosis not present

## 2016-09-09 LAB — COMPLETE METABOLIC PANEL WITH GFR
ALT: 13 U/L (ref 9–46)
AST: 21 U/L (ref 10–35)
Albumin: 4 g/dL (ref 3.6–5.1)
Alkaline Phosphatase: 52 U/L (ref 40–115)
BILIRUBIN TOTAL: 0.9 mg/dL (ref 0.2–1.2)
BUN: 13 mg/dL (ref 7–25)
CO2: 24 mmol/L (ref 20–31)
Calcium: 9 mg/dL (ref 8.6–10.3)
Chloride: 105 mmol/L (ref 98–110)
Creat: 0.96 mg/dL (ref 0.70–1.25)
GFR, EST NON AFRICAN AMERICAN: 81 mL/min (ref >=60)
GFR, Est African American: >89 mL/min (ref >=60)
GLUCOSE: 115 mg/dL — AB (ref 65–99)
POTASSIUM: 4.4 mmol/L (ref 3.5–5.3)
SODIUM: 138 mmol/L (ref 135–146)
Total Protein: 7.1 g/dL (ref 6.1–8.1)

## 2016-09-09 LAB — CBC
HCT: 49.8 % (ref 38.5–50.0)
Hemoglobin: 16.9 g/dL (ref 13.2–17.1)
MCH: 29.7 pg (ref 27.0–33.0)
MCHC: 33.9 g/dL (ref 32.0–36.0)
MCV: 87.5 fL (ref 80.0–100.0)
MPV: 11 fL (ref 7.5–12.5)
Platelets: 165 10*3/uL (ref 140–400)
RBC: 5.69 MIL/uL (ref 4.20–5.80)
RDW: 13.6 % (ref 11.0–15.0)
WBC: 6.7 10*3/uL (ref 3.8–10.8)

## 2016-09-09 LAB — LIPID PANEL
CHOLESTEROL: 195 mg/dL (ref 125–200)
HDL: 43 mg/dL (ref >=40)
LDL CALC: 128 mg/dL (ref <130)
TRIGLYCERIDES: 121 mg/dL (ref <150)
Total CHOL/HDL Ratio: 4.5 Ratio (ref <=5.0)
VLDL: 24 mg/dL (ref <30)

## 2016-09-09 MED ORDER — HYDROCHLOROTHIAZIDE 12.5 MG PO TABS: 12.5000 mg | ORAL_TABLET | Freq: Every day | ORAL | 0 refills | Status: DC

## 2016-09-09 MED ORDER — ATOVAQUONE-PROGUANIL HCL 250-100 MG PO TABS: 1.0000 | ORAL_TABLET | Freq: Every day | ORAL | 0 refills | Status: DC

## 2016-09-09 NOTE — Patient Instructions (Signed)
Thank you for coming in today. Get fasting labs today.  Return in 1 months.  Start HCTZ for blood pressure.  Take malarone to prevent malaria while traveling.    Malaria Malaria is a disease that is caused by a type of single-celled germ that can live inside a person's body (parasite). The malaria parasite is from the Plasmodium family of parasites. This parasite can get into a person's blood when he or she is bitten by a certain type of mosquito (Anopheles mosquito). These mosquitoes are most common in tropical areas of the world. Usually, they are not found in the [place]. If you are bitten by an infected mosquito, the parasites may travel through your blood to your liver. The parasites mature in your liver, then they are released into your blood. They can then invade red blood cells. The parasites multiply inside red blood cells and cause the cells to break open (rupture). This infects more red blood cells. Losing red blood cells may cause you to have a low red blood cell count (anemia). CAUSES This condition is caused by a Plasmodium parasite. Four different types of the parasite can cause disease in humans:  Plasmodium falciparum.  Plasmodium vivax.  Plasmodium ovale.  Plasmodium malariae. Most cases of malaria come from a mosquito bite, but the disease can also be passed from person to person through blood. RISK FACTORS This condition is more likely to develop in:  People who live or travel in an area of the world where malaria is common.  People who receive donated blood (transfusion) or an organ transplant that is contaminated with infected blood cells.  People who share needles with a person who is infected with malaria.  Children.  Pregnant women.  People who have never been exposed to malaria parasites before. These people have not built up protection (immunity) from the parasites. SYMPTOMS The first symptoms of this condition usually start 10 days to 4 weeks  after the mosquito bite. How long and how often the symptoms occur can vary depending on which parasite caused the infection. Symptoms usually come and go or occur in cycles. Symptoms for all types of malaria usually happen in this order: 1. Chills, along with headache, muscle aches, fatigue, and nausea. 2. Fever, along with hot and dry skin. 3. Drenching sweats, along with weakness and exhaustion. Other symptoms include:  Diarrhea or bloody stools (feces).  Yellowing of the skin and the whites of the eyes (jaundice).  Enlarged spleen or liver. Symptoms of severe malaria include:  Trouble breathing.  Seizures.  Loss of consciousness (coma).  Bleeding. DIAGNOSIS This condition may be diagnosed based on:  Your symptoms and medical history. Your health care provider may suspect malaria if you have been living or traveling in an area where the disease is common.  A physical exam.  Blood tests to confirm the diagnosis. These may include:  Examining a blood sample under a microscope. This is the most common way to diagnose malaria. Each type of parasite looks different under the microscope. Identifying which parasite is causing your infection will help your health care provider to determine which medicines will work best.  Complete blood count (CBC) to check for anemia. TREATMENT This condition often requires treatment in the hospital. Many different medicines and combinations of medicines can be used. The best treatment for you will depend on:  Which type of parasite is causing your infection.  Whether various medicines are effective against it.  How you got the infection.  How severe your infection is.  Your age and your general health. HOME CARE INSTRUCTIONS  Take over-the-counter and prescription medicines only as told by your health care provider.  Rest at home until your symptoms are under control.  Drink enough fluid to keep your urine clear or pale  yellow. PREVENTION If you will be traveling to an area where malaria is common, go to the website of the Centers for Disease Control and Prevention (CDC) to check your risk: KeyPreview.se If you are traveling to a high-risk area, make an appointment with your health care provider at least 2 weeks before you leave. You may be given a medicine to help prevent malaria. You can also prevent malaria by:  Using insect repellent.  Staying indoors after daytime starts to turn to nighttime (dusk).  Wearing protective clothing that covers your arms and legs.  Hanging a mosquito net over your bed. SEEK MEDICAL CARE IF:  You have an attack of malaria symptoms.  You develop jaundice. SEEK IMMEDIATE MEDICAL CARE IF:  You have a seizure.  You have bleeding.  You have trouble breathing.   This information is not intended to replace advice given to you by your health care provider. Make sure you discuss any questions you have with your health care provider.   Document Released: 07/09/2004 Document Revised: 04/11/2015 Document Reviewed: 11/30/2014 Elsevier Interactive Patient Education [employer]

## 2016-09-09 NOTE — Progress Notes (Signed)
Angel Krueger is a 70 y.o. male who presents to [facility]: Primary Care Sports Medicine today for follow up.  Patient has several chronic issues and one new complaint to be addressed today.    1.  Essential hypertension: Several BP readings over the past year over 140/80.  He takes his BP every couple months and claims the systolic is usually in the 140s.  He is not taking any medication.  Patient has been exercising 2-3 hours daily for the past 6 months.  Denies chest pain, palpitations, shortness of breath, and dizziness.  2.  Hyperlipidemia:  Patient stopped taking his Lipitor 20 mg after his last lipid panel was normal.  He is taking an aspirin intermittently.  No difficulties tolerating the medicine.  3.  Constipation:  New complaint today.  He is having less frequent, more firm stools over the past few months.  4.  History of migraines:  Patient stopped his prophylactic propanolol because he hasn't had a migraine in 2-3 years.    5.  Health maintenance:  Due for flu shot and Hep C screening.   6. Travel Medicine: Patient is traveling to [place], [place], [place] and [place] in November of this year.  He is requesting Malaria prophylaxis.  He had Typhoid prophylaxis August 2016 but is unsure of his Hepatitis A immunization status.     Past Medical History:  Diagnosis Date  . Generalized headaches    Past Surgical History:  Procedure Laterality Date  . COLON SURGERY     Unknown specifics   Social History  Substance Use Topics  . Smoking status: Never Smoker  . Smokeless tobacco: Not on file  . Alcohol use Not on file   family history is not on file.  ROS as above:  Medications: Current Outpatient Prescriptions  Medication Sig Dispense Refill  . aspirin EC 81 MG tablet Take 1 tablet (81 mg total) by mouth daily. 150 tablet 2  . BRIMONIDINE TARTRATE OP Apply to eye.    .  dorzolamide (TRUSOPT) 2 % ophthalmic solution 1 drop 3 (three) times daily.    Marland Kitchen LATANOPROST OP Apply to eye.    Marland Kitchen atovaquone-proguanil (MALARONE) 250-100 MG TABS tablet Take 1 tablet by mouth daily. Start 2 days prior to travel to prevent malaria 120 tablet 0  . hydrochlorothiazide (HYDRODIURIL) 12.5 MG tablet Take 1 tablet (12.5 mg total) by mouth daily. 90 tablet 0   No current facility-administered medications for this visit.    Allergies  Allergen Reactions  . Penicillins      Exam:  BP (!) 146/88   Pulse 66   Wt 156 lb (70.8 kg)   BMI 22.38 kg/m  Gen: Well NADw Lungs: Normal work of breathing. CTABL Heart: RRR no MRG Abd: NABS, Soft. Nondistended, Nontender Exts: Brisk capillary refill, warm and well perfused.   No results found for this or any previous visit (from the past 24 hour(s)). No results found.    Assessment and Plan: 70 y.o. male with:  1.  Essential Hypertension:  Not currently on any medication.  He is exercising 2-3 hours daily - Start HCTZ 12.5 mg - Recheck in 1 month before his trip - Check CMP and CBC  today  2.  Hyperlipidemia:  Not currently taking Lipitor 20 mg.  Patient wishes to have his lipids checked again before restarting the Lipitor. - Check lipids today - Consider restarting Lipitor if elevated  3.  Constipation:  New problem  for this patient - Start Mirilax as needed.   4.  Health maintenance: - Flu shot given - Hep C screening - Hep A vaccination today - Malarone 250-100 mg malaria prophylaxis given  Immunization History  Administered Date(s) Administered  . Hepatitis A, Adult 09/09/2016  . Influenza Whole 09/08/2005  . Influenza,inj,Quad PF,36+ Mos 09/09/2016  . Influenza-Unspecified 08/01/2015  . Pneumococcal Conjugate-13 01/27/2015  . Pneumococcal Polysaccharide-23 02/15/2013  . Tdap 02/03/2013  . Typhoid Live 07/24/2015  . Zoster 08/01/2015     Orders Placed This Encounter  Procedures  . Hepatitis A vaccine  adult IM  . Flu Vaccine QUAD 36+ mos IM  . CBC  . COMPLETE METABOLIC PANEL WITH GFR  . Lipid panel  . Hepatitis C antibody    Discussed warning signs or symptoms. Please see discharge instructions. Patient expresses understanding.

## 2016-09-10 LAB — HEPATITIS C ANTIBODY: HCV Ab: NEGATIVE

## 2016-09-10 MED ORDER — ATORVASTATIN CALCIUM 20 MG PO TABS: 20.0000 mg | ORAL_TABLET | Freq: Every day | ORAL | 3 refills | Status: DC

## 2016-09-10 NOTE — Addendum Note (Signed)
Addended by: Rodolph BongOREY, Kahil Agner S on: 09/10/2016 07:14 AM   Modules accepted: Orders

## 2016-10-07 ENCOUNTER — Encounter: Payer: Self-pay | Admitting: Family Medicine

## 2016-10-07 ENCOUNTER — Ambulatory Visit (INDEPENDENT_AMBULATORY_CARE_PROVIDER_SITE_OTHER): Payer: Commercial Managed Care - HMO | Admitting: Family Medicine

## 2016-10-07 VITALS — BP 139/85 | HR 77 | Wt 157.0 lb

## 2016-10-07 DIAGNOSIS — I10 Essential (primary) hypertension: Secondary | ICD-10-CM

## 2016-10-07 DIAGNOSIS — E785 Hyperlipidemia, unspecified: Secondary | ICD-10-CM | POA: Diagnosis not present

## 2016-10-07 LAB — COMPLETE METABOLIC PANEL WITH GFR
ALBUMIN: 4.2 g/dL (ref 3.6–5.1)
ALK PHOS: 50 U/L (ref 40–115)
ALT: 16 U/L (ref 9–46)
AST: 22 U/L (ref 10–35)
BUN: 13 mg/dL (ref 7–25)
CALCIUM: 9.8 mg/dL (ref 8.6–10.3)
CHLORIDE: 101 mmol/L (ref 98–110)
CO2: 31 mmol/L (ref 20–31)
Creat: 1.06 mg/dL (ref 0.70–1.25)
GFR, EST AFRICAN AMERICAN: 82 mL/min (ref >=60)
GFR, Est Non African American: 71 mL/min (ref >=60)
Glucose, Bld: 133 mg/dL — ABNORMAL HIGH (ref 65–99)
POTASSIUM: 4 mmol/L (ref 3.5–5.3)
Sodium: 139 mmol/L (ref 135–146)
Total Bilirubin: 1 mg/dL (ref 0.2–1.2)
Total Protein: 7.4 g/dL (ref 6.1–8.1)

## 2016-10-07 MED ORDER — HYDROCHLOROTHIAZIDE 12.5 MG PO TABS: 12.5000 mg | ORAL_TABLET | Freq: Every day | ORAL | 3 refills | Status: DC

## 2016-10-07 NOTE — Patient Instructions (Addendum)
Thank you for coming in today. Recheck Labs.  Return as needed.  Call or go to the emergency room if you get worse, have trouble breathing, have chest pains, or palpitations.

## 2016-10-07 NOTE — Progress Notes (Signed)
       Angel Krueger is a 70 y.o. male who [facility] [place] [facility] today for follow-up hypertension and hyperlipidemia. He is started both hydrochlorothiazide and atorvastatin and feels well and no chest pain palpitations shortness of breath or muscle aches. As noted previously he is traveling internationally for several months starting in late November. He will not be back for at least 3 months.   Past Medical History:  Diagnosis Date  . Generalized headaches    Past Surgical History:  Procedure Laterality Date  . COLON SURGERY     Unknown specifics   Social History  Substance Use Topics  . Smoking status: Never Smoker  . Smokeless tobacco: Not on file  . Alcohol use Not on file   family history is not on file.  ROS as above:  Medications: Current Outpatient Prescriptions  Medication Sig Dispense Refill  . aspirin EC 81 MG tablet Take 1 tablet (81 mg total) by mouth daily. 150 tablet 2  . atorvastatin (LIPITOR) 20 MG tablet Take 1 tablet (20 mg total) by mouth daily. 90 tablet 3  . atovaquone-proguanil (MALARONE) 250-100 MG TABS tablet Take 1 tablet by mouth daily. Start 2 days prior to travel to prevent malaria 120 tablet 0  . BRIMONIDINE TARTRATE OP Apply to eye.    . dorzolamide (TRUSOPT) 2 % ophthalmic solution 1 drop 3 (three) times daily.    . hydrochlorothiazide (HYDRODIURIL) 12.5 MG tablet Take 1 tablet (12.5 mg total) by mouth daily. 90 tablet 3  . LATANOPROST OP Apply to eye.     No current facility-administered medications for this visit.    Allergies  Allergen Reactions  . Penicillins     Health Maintenance Health Maintenance  Topic Date Due  . TETANUS/TDAP  02/03/2023  . COLONOSCOPY  04/17/2023  . INFLUENZA VACCINE  Completed  . ZOSTAVAX  Completed  . Hepatitis C Screening  Completed  . PNA vac Low Risk Adult  Completed     Exam:  BP  139/85   Pulse 77   Wt 157 lb (71.2 kg)   BMI 22.53 kg/m  Gen: Well NAD HEENT: EOMI,  MMM Lungs: Normal work of breathing. CTABL Heart: RRR no MRG Abd: NABS, Soft. Nondistended, Nontender Exts: Brisk capillary refill, warm and well perfused.    No results found for this or any previous visit (from the past 72 hour(s)). No results found.    Assessment and Plan: 70 y.o. male with  Hypertension: Well-controlled continue current medication.  Hyperlipidemia: Doing well with atorvastatin. Check metabolic panel for safety. Otherwise recheck in several months.  Travel: Previously addressed travel medicine.  Okay to fill a 6 month supply of medications for travel.   Orders Placed This Encounter  Procedures  . COMPLETE METABOLIC PANEL WITH GFR    Discussed warning signs or symptoms. Please see discharge instructions. Patient expresses understanding.

## 2016-10-24 DIAGNOSIS — H5703 Miosis: Secondary | ICD-10-CM | POA: Diagnosis not present

## 2016-10-24 DIAGNOSIS — H402213 Chronic angle-closure glaucoma, right eye, severe stage: Secondary | ICD-10-CM | POA: Diagnosis not present

## 2016-10-24 DIAGNOSIS — Z961 Presence of intraocular lens: Secondary | ICD-10-CM | POA: Diagnosis not present

## 2016-10-24 DIAGNOSIS — H402222 Chronic angle-closure glaucoma, left eye, moderate stage: Secondary | ICD-10-CM | POA: Diagnosis not present

## 2016-10-25 ENCOUNTER — Other Ambulatory Visit: Payer: Self-pay | Admitting: Family Medicine

## 2016-10-25 DIAGNOSIS — H402290 Chronic angle-closure glaucoma, unspecified eye, stage unspecified: Secondary | ICD-10-CM

## 2017-03-28 DIAGNOSIS — H402213 Chronic angle-closure glaucoma, right eye, severe stage: Secondary | ICD-10-CM | POA: Diagnosis not present

## 2017-03-28 DIAGNOSIS — Z961 Presence of intraocular lens: Secondary | ICD-10-CM | POA: Diagnosis not present

## 2017-03-28 DIAGNOSIS — H5703 Miosis: Secondary | ICD-10-CM | POA: Diagnosis not present

## 2017-03-28 DIAGNOSIS — H402222 Chronic angle-closure glaucoma, left eye, moderate stage: Secondary | ICD-10-CM | POA: Diagnosis not present

## 2017-09-30 DIAGNOSIS — H402222 Chronic angle-closure glaucoma, left eye, moderate stage: Secondary | ICD-10-CM | POA: Diagnosis not present

## 2017-09-30 DIAGNOSIS — H402213 Chronic angle-closure glaucoma, right eye, severe stage: Secondary | ICD-10-CM | POA: Diagnosis not present

## 2017-09-30 DIAGNOSIS — Z961 Presence of intraocular lens: Secondary | ICD-10-CM | POA: Diagnosis not present

## 2017-11-04 ENCOUNTER — Other Ambulatory Visit: Payer: Self-pay | Admitting: *Deleted

## 2017-11-04 NOTE — Patient Outreach (Signed)
[employer] attempted, no answer, but I was able to leave a message and ask for a return call. If I do not hear back from the member I will call him back within 10 days.  Zara Councilarroll C. Burgess EstelleSpinks, MSN, [facility] Center[facility]NP-BC Gerontological Nurse Practitioner [facility] [phone]

## 2017-11-11 ENCOUNTER — Encounter: Payer: Self-pay | Admitting: *Deleted

## 2017-11-22 DIAGNOSIS — H524 Presbyopia: Secondary | ICD-10-CM | POA: Diagnosis not present

## 2017-12-05 ENCOUNTER — Other Ambulatory Visit: Payer: Self-pay | Admitting: Family Medicine

## 2017-12-05 DIAGNOSIS — I10 Essential (primary) hypertension: Secondary | ICD-10-CM

## 2017-12-17 ENCOUNTER — Encounter: Payer: Self-pay | Admitting: Family Medicine

## 2017-12-17 ENCOUNTER — Ambulatory Visit (INDEPENDENT_AMBULATORY_CARE_PROVIDER_SITE_OTHER): Payer: Medicare HMO | Admitting: Family Medicine

## 2017-12-17 ENCOUNTER — Other Ambulatory Visit: Payer: Self-pay | Admitting: Family Medicine

## 2017-12-17 VITALS — BP 150/95 | HR 86 | Wt 158.0 lb

## 2017-12-17 DIAGNOSIS — I10 Essential (primary) hypertension: Secondary | ICD-10-CM

## 2017-12-17 DIAGNOSIS — Z125 Encounter for screening for malignant neoplasm of prostate: Secondary | ICD-10-CM | POA: Diagnosis not present

## 2017-12-17 DIAGNOSIS — Z0000 Encounter for general adult medical examination without abnormal findings: Secondary | ICD-10-CM | POA: Diagnosis not present

## 2017-12-17 DIAGNOSIS — E785 Hyperlipidemia, unspecified: Secondary | ICD-10-CM

## 2017-12-17 DIAGNOSIS — R7989 Other specified abnormal findings of blood chemistry: Secondary | ICD-10-CM | POA: Diagnosis not present

## 2017-12-17 LAB — COMPLETE METABOLIC PANEL WITH GFR
AG RATIO: 1.2 (calc) (ref 1.0–2.5)
ALBUMIN MSPROF: 4.1 g/dL (ref 3.6–5.1)
ALKALINE PHOSPHATASE (APISO): 56 U/L (ref 40–115)
ALT: 18 U/L (ref 9–46)
AST: 20 U/L (ref 10–35)
BUN: 13 mg/dL (ref 7–25)
CHLORIDE: 102 mmol/L (ref 98–110)
CO2: 31 mmol/L (ref 20–32)
Calcium: 9.7 mg/dL (ref 8.6–10.3)
Creat: 0.95 mg/dL (ref 0.70–1.18)
GFR, EST AFRICAN AMERICAN: 94 mL/min/{1.73_m2} (ref >=60)
GFR, Est Non African American: 81 mL/min/{1.73_m2} (ref >=60)
GLUCOSE: 114 mg/dL — AB (ref 65–99)
Globulin: 3.3 g/dL (calc) (ref 1.9–3.7)
POTASSIUM: 4.4 mmol/L (ref 3.5–5.3)
Sodium: 138 mmol/L (ref 135–146)
TOTAL PROTEIN: 7.4 g/dL (ref 6.1–8.1)
Total Bilirubin: 0.7 mg/dL (ref 0.2–1.2)

## 2017-12-17 LAB — CBC
HCT: 48.6 % (ref 38.5–50.0)
HEMOGLOBIN: 16.9 g/dL (ref 13.2–17.1)
MCH: 29.8 pg (ref 27.0–33.0)
MCHC: 34.8 g/dL (ref 32.0–36.0)
MCV: 85.6 fL (ref 80.0–100.0)
MPV: 11.1 fL (ref 7.5–12.5)
PLATELETS: 174 10*3/uL (ref 140–400)
RBC: 5.68 10*6/uL (ref 4.20–5.80)
RDW: 12.8 % (ref 11.0–15.0)
WBC: 7.4 10*3/uL (ref 3.8–10.8)

## 2017-12-17 LAB — LIPID PANEL W/REFLEX DIRECT LDL
CHOLESTEROL: 145 mg/dL (ref <200)
HDL: 47 mg/dL (ref >40)
LDL Cholesterol (Calc): 78 mg/dL (calc)
Non-HDL Cholesterol (Calc): 98 mg/dL (calc) (ref <130)
Total CHOL/HDL Ratio: 3.1 (calc) (ref <5.0)
Triglycerides: 123 mg/dL (ref <150)

## 2017-12-17 LAB — PSA: PSA: 1 ng/mL (ref <=4.0)

## 2017-12-17 MED ORDER — ATORVASTATIN CALCIUM 20 MG PO TABS: 20.0000 mg | ORAL_TABLET | Freq: Every day | ORAL | 3 refills | Status: DC

## 2017-12-17 MED ORDER — SILDENAFIL CITRATE 100 MG PO TABS: 50.0000 mg | ORAL_TABLET | Freq: Every day | ORAL | 11 refills | Status: DC | PRN

## 2017-12-17 MED ORDER — ATOVAQUONE-PROGUANIL HCL 250-100 MG PO TABS: 1.0000 | ORAL_TABLET | Freq: Every day | ORAL | 0 refills | Status: DC

## 2017-12-17 MED ORDER — HYDROCHLOROTHIAZIDE 25 MG PO TABS: 25.0000 mg | ORAL_TABLET | Freq: Every day | ORAL | 3 refills | Status: DC

## 2017-12-17 NOTE — Progress Notes (Signed)
HPI: Angel Krueger is a 72 y.o. male  who [facility] [place] today, 12/17/17,  for Medicare Annual Wellness Exam  Patient presents for annual physical/Medicare wellness exam. no complaints today.  He notes that his blood pressure when measured at home is chest pain palpitations or shortness of breath and feels well.  Additionally he notes that he will be traveling to [place] for 2 months later this month and has used malaria prophylaxis previously.  He would like a repeat refill if possible.  He exercises regularly and follows a relatively low-salt diet.   Past medical, surgical, social and family history reviewed:  Patient Active Problem List   Diagnosis Date Noted  . Constipation 09/09/2016  . Skin lesion of scalp 02/29/2016  . Hyperplastic colon polyp 04/20/2013  . HTN (hypertension) 03/15/2013  . Hyperlipidemia with target LDL less than 130 02/16/2013  . History of migraine 02/15/2013    Past Surgical History:  Procedure Laterality Date  . COLON SURGERY     Unknown specifics    Social History   Socioeconomic History  . Marital status: Married    Spouse name: Not on file  . Number of children: Not on file  . Years of education: Not on file  . Highest education level: Not on file  Social Needs  . Financial resource strain: Not on file  . Food insecurity - worry: Not on file  . Food insecurity - inability: Not on file  . Transportation needs - medical: Not on file  . Transportation needs - non-medical: Not on file  Occupational History  . Not on file  Tobacco Use  . Smoking status: Never Smoker  . Smokeless tobacco: Never Used  Substance and Sexual Activity  . Alcohol use: Not on file  . Drug use: No  . Sexual activity: Not on file  Other Topics Concern  . Not on file  Social History Narrative  . Not on file    No family history on file.   Current medication list and allergy/intolerance information reviewed:     Outpatient Encounter Medications as of 12/17/2017  Medication Sig  . aspirin EC 81 MG tablet Take 1 tablet (81 mg total) by mouth daily.  Marland Kitchen. atorvastatin (LIPITOR) 20 MG tablet Take 1 tablet (20 mg total) by mouth daily.  Marland Kitchen. atovaquone-proguanil (MALARONE) 250-100 MG TABS tablet Take 1 tablet by mouth daily. Start 2 days prior to travel to prevent malaria  . BRIMONIDINE TARTRATE OP Apply to eye.  . dorzolamide (TRUSOPT) 2 % ophthalmic solution 1 drop 3 (three) times daily.  . hydrochlorothiazide (HYDRODIURIL) 25 MG tablet Take 1 tablet (25 mg total) by mouth daily.  Marland Kitchen. LATANOPROST OP Apply to eye.  . [DISCONTINUED] atorvastatin (LIPITOR) 20 MG tablet Take 1 tablet (20 mg total) by mouth daily. APPOINTMENT NEEDED FOR FURTHER REFILLS  . [DISCONTINUED] atovaquone-proguanil (MALARONE) 250-100 MG TABS tablet Take 1 tablet by mouth daily. Start 2 days prior to travel to prevent malaria  . [DISCONTINUED] hydrochlorothiazide (HYDRODIURIL) 12.5 MG tablet Take 1 tablet (12.5 mg total) by mouth daily. APPOINTMENT NEEDED FOR FURTHER REFILLS  . sildenafil (VIAGRA) 100 MG tablet Take 0.5-1 tablets (50-100 mg total) by mouth daily as needed for erectile dysfunction.   No facility-administered encounter medications on file as of 12/17/2017.     Allergies  Allergen Reactions  . Penicillins        Review of Systems: No headache, visual changes, nausea, vomiting, diarrhea, constipation, dizziness, abdominal pain, skin  rash, fevers, chills, night sweats, weight loss, swollen lymph nodes, body aches, joint swelling, muscle aches, chest pain, shortness of breath, mood changes, visual or auditory hallucinations.     Medicare Wellness Questionnaire  Are there smokers in your home (other than you)? no  Depression screen [facility] 2/9 12/17/2017 12/17/2017 02/29/2016 01/27/2015  Decreased Interest 0 0 0 0  Down, Depressed, Hopeless 0 0 0 0  PHQ - 2 Score 0 0 0 0        Activities of Daily Living In your present  state of health, do you have any difficulty performing the following activities?:  Driving? no Managing money?  no Feeding yourself? no Getting from bed to chair? no Climbing a flight of stairs? no Preparing food and eating?: no Bathing or showering? no Getting dressed: no Getting to the toilet? no Using the toilet: no Moving around from place to place: no In the past year have you fallen or had a near fall?: no  Hearing Difficulties:  Do you often ask people to speak up or repeat themselves? no Do you experience ringing or noises in your ears? no  Do you have difficulty understanding soft or whispered voices? no  Memory Difficulties:  Do you feel that you have a problem with memory? no  Do you often misplace items? no  Do you feel safe at home?  yes  Sexual Health:   Are you sexually active?  Yes  Do you have more than one partner?  No   Risk Factors  Current exercise habits: walking daily  Dietary issues discussed:Yes  Cardiac risk factors: HTN, Age, HLD  Exam:  BP (!) 150/95   Pulse 86   Wt 158 lb (71.7 kg)   BMI 22.67 kg/m  Vision by Snellen chart: right eye:see nurse notes, left eye:see nurse notes  Constitutional: VS see above. General Appearance: alert, well-developed, well-nourished, NAD  Ears, Nose, Mouth, Throat: MMM  Neck: No masses, trachea midline.   Respiratory: Normal respiratory effort. no wheeze, no rhonchi, no rales  Cardiovascular:No lower extremity edema.   Musculoskeletal: Gait normal. No clubbing/cyanosis of digits.   Neurological: Normal balance/coordination. No tremor. Recalls 3 objects and able to read face of watch with correct time.   Skin: warm, dry, intact. No rash/ulcer.   Psychiatric: Normal judgment/insight. Normal mood and affect. Oriented x3.     ASSESSMENT/PLAN:   Encounter for Medicare annual wellness exam  HTN:  Increase hydrochlorothiazide to 25 mg daily.  Check metabolic panel.  Hyperlipidemia: Check lipids    Prostate cancer screening: Check PSA  Malaria prophylaxis: Malarone   Orders Placed This Encounter  Procedures  . CBC  . COMPLETE METABOLIC PANEL WITH GFR  . Lipid Panel w/reflex Direct LDL  . PSA      Health Maintenance Health Maintenance  Topic Date Due  . TETANUS/TDAP  02/03/2023  . COLONOSCOPY  04/17/2023  . INFLUENZA VACCINE  Completed  . Hepatitis C Screening  Completed  . PNA vac Low Risk Adult  Completed    Immunization History  Administered Date(s) Administered  . Hepatitis A, Adult 09/09/2016  . Influenza Whole 09/08/2005  . Influenza,inj,Quad PF,6+ Mos 09/09/2016  . Influenza-Unspecified 08/01/2015, 09/22/2017  . Pneumococcal Conjugate-13 01/27/2015  . Pneumococcal Polysaccharide-23 02/15/2013  . Tdap 02/03/2013  . Typhoid Live 07/24/2015  . Zoster 08/01/2015     During the course of the visit the patient was educated and counseled about appropriate screening and preventive services as noted above.   Patient Instructions (the  written plan) was given to the patient.  Medicare Attestation I have personally reviewed: The patient's medical and social history Their use of alcohol, tobacco or illicit drugs Their current medications and supplements The patient's functional ability including ADLs,fall risks, home safety risks, cognitive, and hearing and visual impairment Diet and physical activities Evidence for depression or mood disorders  The patient's weight, height, BMI, and visual acuity have been recorded in the chart.  I have made referrals, counseling, and provided education to the patient based on review of the above and I have provided the patient with a written personalized care plan for preventive services.

## 2017-12-17 NOTE — Patient Instructions (Addendum)
Thank you for coming in today. Get labs today.  Increase HCTZ to 25 mg daily Use milaria medicine as needed.  Recheck every 6 months.  Return sooner if needed.

## 2017-12-17 NOTE — Progress Notes (Signed)
Labs reordered per lab protocol.

## 2018-04-02 DIAGNOSIS — H402222 Chronic angle-closure glaucoma, left eye, moderate stage: Secondary | ICD-10-CM | POA: Diagnosis not present

## 2018-04-02 DIAGNOSIS — H402213 Chronic angle-closure glaucoma, right eye, severe stage: Secondary | ICD-10-CM | POA: Diagnosis not present

## 2018-04-02 DIAGNOSIS — Z961 Presence of intraocular lens: Secondary | ICD-10-CM | POA: Diagnosis not present

## 2018-08-07 ENCOUNTER — Encounter: Payer: Self-pay | Admitting: Physician Assistant

## 2018-08-07 ENCOUNTER — Ambulatory Visit (INDEPENDENT_AMBULATORY_CARE_PROVIDER_SITE_OTHER): Payer: Medicare HMO

## 2018-08-07 ENCOUNTER — Ambulatory Visit (INDEPENDENT_AMBULATORY_CARE_PROVIDER_SITE_OTHER): Payer: Medicare HMO | Admitting: Physician Assistant

## 2018-08-07 VITALS — BP 154/88 | HR 54 | Temp 97.7°F | Wt 154.0 lb

## 2018-08-07 DIAGNOSIS — R42 Dizziness and giddiness: Secondary | ICD-10-CM

## 2018-08-07 DIAGNOSIS — Z9114 Patient's other noncompliance with medication regimen: Secondary | ICD-10-CM | POA: Diagnosis not present

## 2018-08-07 DIAGNOSIS — I10 Essential (primary) hypertension: Secondary | ICD-10-CM | POA: Diagnosis not present

## 2018-08-07 DIAGNOSIS — G459 Transient cerebral ischemic attack, unspecified: Secondary | ICD-10-CM | POA: Diagnosis not present

## 2018-08-07 MED ORDER — HYDROCHLOROTHIAZIDE 12.5 MG PO TABS: 12.5000 mg | ORAL_TABLET | Freq: Every day | ORAL | 0 refills | Status: DC

## 2018-08-07 NOTE — Patient Instructions (Addendum)
- Goal <140/90 - Re-start Hydrochlorothiazide. Start with 12.5 mg daily (can cut your current tablets in 1/2 or start the new prescription) - monitor and log blood pressures at home - check around the same time each day in a relaxed setting - Limit salt to <2000 mg/day - Follow DASH eating plan - avoid alcohol   Dizziness Dizziness is a common problem. It makes you feel unsteady or light-headed. You may feel like you are about to pass out (faint). Dizziness can lead to getting hurt if you stumble or fall. Dizziness can be caused by many things, including:  Medicines.  Not having enough water in your body (dehydration).  Illness.  Follow these instructions at home: Eating and drinking  Drink enough fluid to keep your pee (urine) clear or pale yellow. This helps to keep you from getting dehydrated. Try to drink more clear fluids, such as water.  Do not drink alcohol.  Limit how much caffeine you drink or eat, if your doctor tells you to do that.  Limit how much salt (sodium) you drink or eat, if your doctor tells you to do that. Activity  Avoid making quick movements. ? When you stand up from sitting in a chair, steady yourself until you feel okay. ? In the morning, first sit up on the side of the bed. When you feel okay, stand slowly while you hold onto something. Do this until you know that your balance is fine.  If you need to stand in one place for a long time, move your legs often. Tighten and relax the muscles in your legs while you are standing.  Do not drive or use heavy machinery if you feel dizzy.  Avoid bending down if you feel dizzy. Place items in your home so you can reach them easily without leaning over. Lifestyle  Do not use any products that contain nicotine or tobacco, such as cigarettes and e-cigarettes. If you need help quitting, ask your doctor.  Try to lower your stress level. You can do this by using methods such as yoga or meditation. Talk with your  doctor if you need help. General instructions  Watch your dizziness for any changes.  Take over-the-counter and prescription medicines only as told by your doctor. Talk with your doctor if you think that you are dizzy because of a medicine that you are taking.  Tell a friend or a family member that you are feeling dizzy. If he or she notices any changes in your behavior, have this person call your doctor.  Keep all follow-up visits as told by your doctor. This is important. Contact a doctor if:  Your dizziness does not go away.  Your dizziness or light-headedness gets worse.  You feel sick to your stomach (nauseous).  You have trouble hearing.  You have new symptoms.  You are unsteady on your feet.  You feel like the room is spinning. Get help right away if:  You throw up (vomit) or have watery poop (diarrhea), and you cannot eat or drink anything.  You have trouble: ? Talking. ? Walking. ? Swallowing. ? Using your arms, hands, or legs.  You feel generally weak.  You are not thinking clearly, or you have trouble forming sentences. A friend or family member may notice this.  You have: ? Chest pain. ? Pain in your belly (abdomen). ? Shortness of breath. ? Sweating.  Your vision changes.  You are bleeding.  You have a very bad headache.  You have neck  pain or a stiff neck.  You have a fever. These symptoms may be an emergency. Do not wait to see if the symptoms will go away. Get medical help right away. Call your local emergency services (911 in the [place] Do not drive yourself to the hospital. Summary  Dizziness makes you feel unsteady or light-headed. You may feel like you are about to pass out (faint).  Drink enough fluid to keep your pee (urine) clear or pale yellow. Do not drink alcohol.  Avoid making quick movements if you feel dizzy.  Watch your dizziness for any changes. This information is not intended to replace advice given to you by your health  care provider. Make sure you discuss any questions you have with your health care provider. Document Released: 11/14/2011 Document Revised: 12/12/2016 Document Reviewed: 12/12/2016 Elsevier Interactive Patient Education  2017 [employer]

## 2018-08-07 NOTE — Progress Notes (Signed)
HPI:                                                                Gaston IslamBabubhai Pederson is a 72 y.o. male who [facility] Medicine today for dizziness  Pleasant 72 yo M with PMH HTN not compliant with medications, hx of migraine, chronic angle-closure glaucoma and miosis presents with new onset dizziness this morning, began while getting up from bed Described as "spinning inside my eyes." Associated with nausea and generalized weakness. Home BP was 172/100 Spell lasted approx. 2 hours. He had 1 episode of vomiting and symptoms resolved spontaneously. He has a history of migraines, but denies any headache and reports his last headache was approx 15 years ago. Denies history of vertigo/BPPV Denies vision change, diplopia Denies fever, chills, URI symptoms Denies focal weakness, facial droop, speech change, gait disturbance Reports he is not currently dizzy, but he feels weak.  Dep[facility] screen [facility] 2/[facility] 12/17/2017 12/17/2017 02/29/2016 01/27/2015  Decreased Interest 0 0 0 0  Down, Depressed, Hopeless 0 0 0 0  PHQ - 2 Score 0 0 0 0    No flowsheet data found.    Past Medical History:  Diagnosis Date  . Generalized headaches    Past Surgical History:  Procedure Laterality Date  . COLON SURGERY     Unknown specifics   Social History   Tobacco Use  . Smoking status: Never Smoker  . Smokeless tobacco: Never Used  Substance Use Topics  . Alcohol use: Not on file   family history is not on file.    ROS: negative except as noted in the HPI  Medications: Current Outpatient Medications  Medication Sig Dispense Refill  . BRIMONIDINE TARTRATE OP Apply to eye.    . dorzolamide (TRUSOPT) 2 % ophthalmic solution 1 drop 3 (three) times daily.    Marland Kitchen. aspirin EC 81 MG tablet Take 1 tablet (81 mg total) by mouth daily. 150 tablet 2  . atorvastatin (LIPITOR) 20 MG tablet Take 1 tablet (20 mg total) by mouth daily. (Patient not taking: Reported  on 08/07/2018) 90 tablet 3  . atovaquone-proguanil (MALARONE) 250-100 MG TABS tablet Take 1 tablet by mouth daily. Start 2 days prior to travel to prevent malaria (Patient not taking: Reported on 08/07/2018) 120 tablet 0  . hydrochlorothiazide (HYDRODIURIL) 25 MG tablet Take 1 tablet (25 mg total) by mouth daily. (Patient not taking: Reported on 08/07/2018) 90 tablet 3  . LATANOPROST OP Apply to eye.    . sildenafil (VIAGRA) 100 MG tablet Take 0.5-1 tablets (50-100 mg total) by mouth daily as needed for erectile dysfunction. (Patient not taking: Reported on 08/07/2018) 5 tablet 11   No current facility-administered medications for this visit.    Allergies  Allergen Reactions  . Penicillins        Objective:  BP (!) 154/88   Pulse (!) 54   Temp 97.7 F (36.5 C) (Oral)   Wt 154 lb (69.9 kg)   BMI 22.10 kg/m  Gen: well-groomed, not ill-appearing, no acute distress HEENT: head normocephalic, atraumatic; oropharynx clear, moist mucus membranes; neck supple, no meningeal signs Pulm: Normal work of breathing, normal phonation, clear to auscultation bilaterally CV: bradycardic rate, regular rhythm, s1 and s2 distinct, no  murmurs, clicks or rubs Neuro:  Pupils are 2mm, symmetric, not reactive, otherwise cranial nerves II-XII intact, no nystagmus, normal finger-to-nose, normal heel-to-shin, negative pronator drift, normal rapid alternating movements, normal tone, no tremor MSK: strength 5/5 and symmetric in bilateral upper and lower extremities, normal gait and station Mental Status: alert and oriented x 3, speech articulate, and thought processes clear and goal-directed  Orthostatic VS for the past 24 hrs (Last 3 readings):  BP- Lying Pulse- Lying BP- Sitting Pulse- Sitting BP- Standing at 0 minutes Pulse- Standing at 0 minutes  08/07/18 1434 158/85 60 156/84 54 159/89 62   ECG 08/07/2018 14:53 Vent Rate 57 bpm PR-I 150 ms QRS 80 ms QT/QTc 436/424 Sinus bradycardia with sinus  arrhythmia Minimal voltage criteria for LVH, may be normal variant Borderline ECG   No results found for this or any previous visit (from the past 72 hour(s)). No results found.    Assessment and Plan: 72 y.o. male with   .Josey was seen today for dizziness.  Diagnoses and all orders for this visit:  Dizziness -     CT Head Wo Contrast -     Basic metabolic panel -     CBC with Differential/Platelet -     EKG 12-Lead  Uncontrolled hypertension -     hydrochlorothiazide (HYDRODIURIL) 12.5 MG tablet; Take 1 tablet (12.5 mg total) by mouth daily.  Noncompliance with medication regimen  Essential hypertension -     EKG 12-Lead   Symptoms concerning for TIA. BP is not controlled due to medication noncompliance CT Head w/o contrast pending stat ECG sinus bradycardia, left axis, no AV block or arrhythmia, no ST Or T wave abnormality No prior ECG for comparison Reassuring neuro exam, no current deficits No orthostasis, however BP is not controlled  Re-starting HCTZ 12.5 mg daily with close follow-up with PCP in 2 weeks  Patient education and anticipatory guidance given Patient agrees with treatment plan Follow-up as needed if symptoms worsen or fail to improve  Levonne Hubert PA-C

## 2018-08-08 LAB — BASIC METABOLIC PANEL
BUN: 12 mg/dL (ref 7–25)
CALCIUM: 9.4 mg/dL (ref 8.6–10.3)
CO2: 29 mmol/L (ref 20–32)
Chloride: 103 mmol/L (ref 98–110)
Creat: 1 mg/dL (ref 0.70–1.18)
Glucose, Bld: 126 mg/dL — ABNORMAL HIGH (ref 65–99)
POTASSIUM: 4.6 mmol/L (ref 3.5–5.3)
SODIUM: 138 mmol/L (ref 135–146)

## 2018-08-08 LAB — CBC WITH DIFFERENTIAL/PLATELET
BASOS PCT: 0.5 %
Basophils Absolute: 44 cells/uL (ref 0–200)
Eosinophils Absolute: 44 cells/uL (ref 15–500)
Eosinophils Relative: 0.5 %
HCT: 49.6 % (ref 38.5–50.0)
HEMOGLOBIN: 16.9 g/dL (ref 13.2–17.1)
Lymphs Abs: 1496 cells/uL (ref 850–3900)
MCH: 30.2 pg (ref 27.0–33.0)
MCHC: 34.1 g/dL (ref 32.0–36.0)
MCV: 88.6 fL (ref 80.0–100.0)
MPV: 11.4 fL (ref 7.5–12.5)
Monocytes Relative: 4.2 %
NEUTROS ABS: 6751 {cells}/uL (ref 1500–7800)
Neutrophils Relative %: 77.6 %
Platelets: 159 10*3/uL (ref 140–400)
RBC: 5.6 10*6/uL (ref 4.20–5.80)
RDW: 12.9 % (ref 11.0–15.0)
TOTAL LYMPHOCYTE: 17.2 %
WBC: 8.7 10*3/uL (ref 3.8–10.8)
WBCMIX: 365 {cells}/uL (ref 200–950)

## 2018-08-12 ENCOUNTER — Encounter: Payer: Self-pay | Admitting: Physician Assistant

## 2018-08-12 DIAGNOSIS — R739 Hyperglycemia, unspecified: Secondary | ICD-10-CM | POA: Insufficient documentation

## 2018-08-21 ENCOUNTER — Ambulatory Visit: Payer: Medicare HMO | Admitting: Family Medicine

## 2018-08-23 ENCOUNTER — Encounter: Payer: Self-pay | Admitting: Physician Assistant

## 2018-08-23 DIAGNOSIS — G459 Transient cerebral ischemic attack, unspecified: Secondary | ICD-10-CM | POA: Insufficient documentation

## 2018-08-23 DIAGNOSIS — Z9114 Patient's other noncompliance with medication regimen: Secondary | ICD-10-CM | POA: Insufficient documentation

## 2018-08-23 DIAGNOSIS — R42 Dizziness and giddiness: Secondary | ICD-10-CM | POA: Insufficient documentation

## 2018-10-01 ENCOUNTER — Other Ambulatory Visit: Payer: Self-pay

## 2018-10-01 DIAGNOSIS — I10 Essential (primary) hypertension: Secondary | ICD-10-CM

## 2018-10-01 MED ORDER — HYDROCHLOROTHIAZIDE 12.5 MG PO TABS: 12.5000 mg | ORAL_TABLET | Freq: Every day | ORAL | 0 refills | Status: DC

## 2018-10-02 ENCOUNTER — Ambulatory Visit (INDEPENDENT_AMBULATORY_CARE_PROVIDER_SITE_OTHER): Payer: Medicare HMO

## 2018-10-02 ENCOUNTER — Ambulatory Visit (INDEPENDENT_AMBULATORY_CARE_PROVIDER_SITE_OTHER): Payer: Medicare HMO | Admitting: Sports Medicine

## 2018-10-02 ENCOUNTER — Encounter: Payer: Self-pay | Admitting: Sports Medicine

## 2018-10-02 DIAGNOSIS — R059 Cough, unspecified: Secondary | ICD-10-CM

## 2018-10-02 DIAGNOSIS — R05 Cough: Secondary | ICD-10-CM

## 2018-10-02 DIAGNOSIS — I10 Essential (primary) hypertension: Secondary | ICD-10-CM

## 2018-10-02 MED ORDER — PREDNISONE 50 MG PO TABS
50.0000 mg | ORAL_TABLET | Freq: Every day | ORAL | 0 refills | Status: DC
Start: 2018-10-02 — End: 2019-06-21

## 2018-10-02 MED ORDER — BENZONATATE 200 MG PO CAPS: 200.0000 mg | ORAL_CAPSULE | Freq: Three times a day (TID) | ORAL | 0 refills | Status: DC | PRN

## 2018-10-02 MED ORDER — AZITHROMYCIN 250 MG PO TABS: ORAL_TABLET | ORAL | 0 refills | Status: DC

## 2018-10-02 MED ORDER — HYDROCHLOROTHIAZIDE 12.5 MG PO TABS: 12.5000 mg | ORAL_TABLET | Freq: Every day | ORAL | 0 refills | Status: DC

## 2018-10-02 NOTE — Assessment & Plan Note (Signed)
Present for 1 week and a 72 year old male. Abnormal right middle lobe lung sounds. Adding a chest x-ray. Tessalon Perles, azithromycin, prednisone.

## 2018-10-02 NOTE — Assessment & Plan Note (Signed)
Well-controlled on current medications, refilling hydrochlorothiazide to [facility] per patient request.

## 2018-10-02 NOTE — Progress Notes (Signed)
Subjective:    CC: Coughing  HPI: This is a pleasant 72 year old male non-smoker, for the past week he has had a cough worse at night, productive of whitish sputum, no fevers, chills, night sweats, weight loss, no skin rash, no GI symptoms.  No shortness of breath during the day.  Persistent cough when he tries to speak.  He has had bronchitis in the past, this feels fairly similar.  Hypertension: Started on hydrochlorothiazide 12.5 mg, well-controlled, asking for refill to [facility].  I reviewed the past medical history, family history, social history, surgical history, and allergies today and no changes were needed.  Please see the problem list section below in epic for further details.  Past Medical History: Past Medical History:  Diagnosis Date  . Generalized headaches    Past Surgical History: Past Surgical History:  Procedure Laterality Date  . COLON SURGERY     Unknown specifics   Social History: Social History   Socioeconomic History  . Marital status: Married    Spouse name: Not on file  . Number of children: Not on file  . Years of education: Not on file  . Highest education level: Not on file  Occupational History  . Not on file  Social Needs  . Financial resource strain: Not on file  . Food insecurity:    Worry: Not on file    Inability: Not on file  . Transportation needs:    Medical: Not on file    Non-medical: Not on file  Tobacco Use  . Smoking status: Never Smoker  . Smokeless tobacco: Never Used  Substance and Sexual Activity  . Alcohol use: Not on file  . Drug use: No  . Sexual activity: Not on file  Lifestyle  . Physical activity:    Days per week: Not on file    Minutes per session: Not on file  . Stress: Not on file  Relationships  . Social connections:    Talks on phone: Not on file    Gets together: Not on file    Attends religious service: Not on file    Active member of club or organization: Not on file    Attends meetings of clubs  or organizations: Not on file    Relationship status: Not on file  Other Topics Concern  . Not on file  Social History Narrative  . Not on file   Family History: No family history on file. Allergies: Allergies  Allergen Reactions  . Penicillins    Medications: See med rec.  Review of Systems: No fevers, chills, night sweats, weight loss, chest pain, or shortness of breath.   Objective:    General: Well Developed, well nourished, and in no acute distress.  Neuro: Alert and oriented x3, extra-ocular muscles intact, sensation grossly intact.  HEENT: Normocephalic, atraumatic, pupils equal round reactive to light, neck supple, no masses, no lymphadenopathy, thyroid nonpalpable.  Oropharynx, nasopharynx, ear canals unremarkable, coughing in the exam room. Skin: Warm and dry, no rashes. Cardiac: Regular rate and rhythm, no murmurs rubs or gallops, no lower extremity edema.  Respiratory: Coarse sounds in the right middle lobe. Not using accessory muscles, speaking in full sentences.  Impression and Recommendations:    Coughing Present for 1 week and a 72 year old male. Abnormal right middle lobe lung sounds. Adding a chest x-ray. Tessalon Perles, azithromycin, prednisone.  HTN (hypertension) Well-controlled on current medications, refilling hydrochlorothiazide to [facility] per patient request. ___________________________________________ Ihor Austin. Benjamin Stain, M.D., ABFM., CAQSM. Primary Care and Sports  Medicine [facility]  Adjunct Professor of Family Medicine  [employer]

## 2018-10-06 DIAGNOSIS — H402222 Chronic angle-closure glaucoma, left eye, moderate stage: Secondary | ICD-10-CM | POA: Diagnosis not present

## 2018-10-06 DIAGNOSIS — H402213 Chronic angle-closure glaucoma, right eye, severe stage: Secondary | ICD-10-CM | POA: Diagnosis not present

## 2018-10-06 DIAGNOSIS — Z961 Presence of intraocular lens: Secondary | ICD-10-CM | POA: Diagnosis not present

## 2018-10-19 ENCOUNTER — Other Ambulatory Visit: Payer: Self-pay

## 2018-10-19 DIAGNOSIS — I10 Essential (primary) hypertension: Secondary | ICD-10-CM

## 2018-10-19 MED ORDER — HYDROCHLOROTHIAZIDE 12.5 MG PO TABS: 12.5000 mg | ORAL_TABLET | Freq: Every day | ORAL | 0 refills | Status: DC

## 2018-10-27 ENCOUNTER — Other Ambulatory Visit: Payer: Self-pay

## 2018-10-27 DIAGNOSIS — I10 Essential (primary) hypertension: Secondary | ICD-10-CM

## 2018-10-27 MED ORDER — HYDROCHLOROTHIAZIDE 12.5 MG PO TABS: 12.5000 mg | ORAL_TABLET | Freq: Every day | ORAL | 0 refills | Status: DC

## 2018-11-16 ENCOUNTER — Other Ambulatory Visit: Payer: Self-pay

## 2018-11-16 DIAGNOSIS — I10 Essential (primary) hypertension: Secondary | ICD-10-CM

## 2018-11-16 MED ORDER — HYDROCHLOROTHIAZIDE 12.5 MG PO TABS: 12.5000 mg | ORAL_TABLET | Freq: Every day | ORAL | 0 refills | Status: DC

## 2018-11-21 IMAGING — DX DG CHEST 2V
2 series · 2 of 2 positions shown · non-contrast
Comparison: Chest x-ray of January 08, 2010

CLINICAL DATA: One week history of cough. Coarse breath sounds in
the right middle lobe.

EXAM:
CHEST - 2 VIEW

[chest pa]
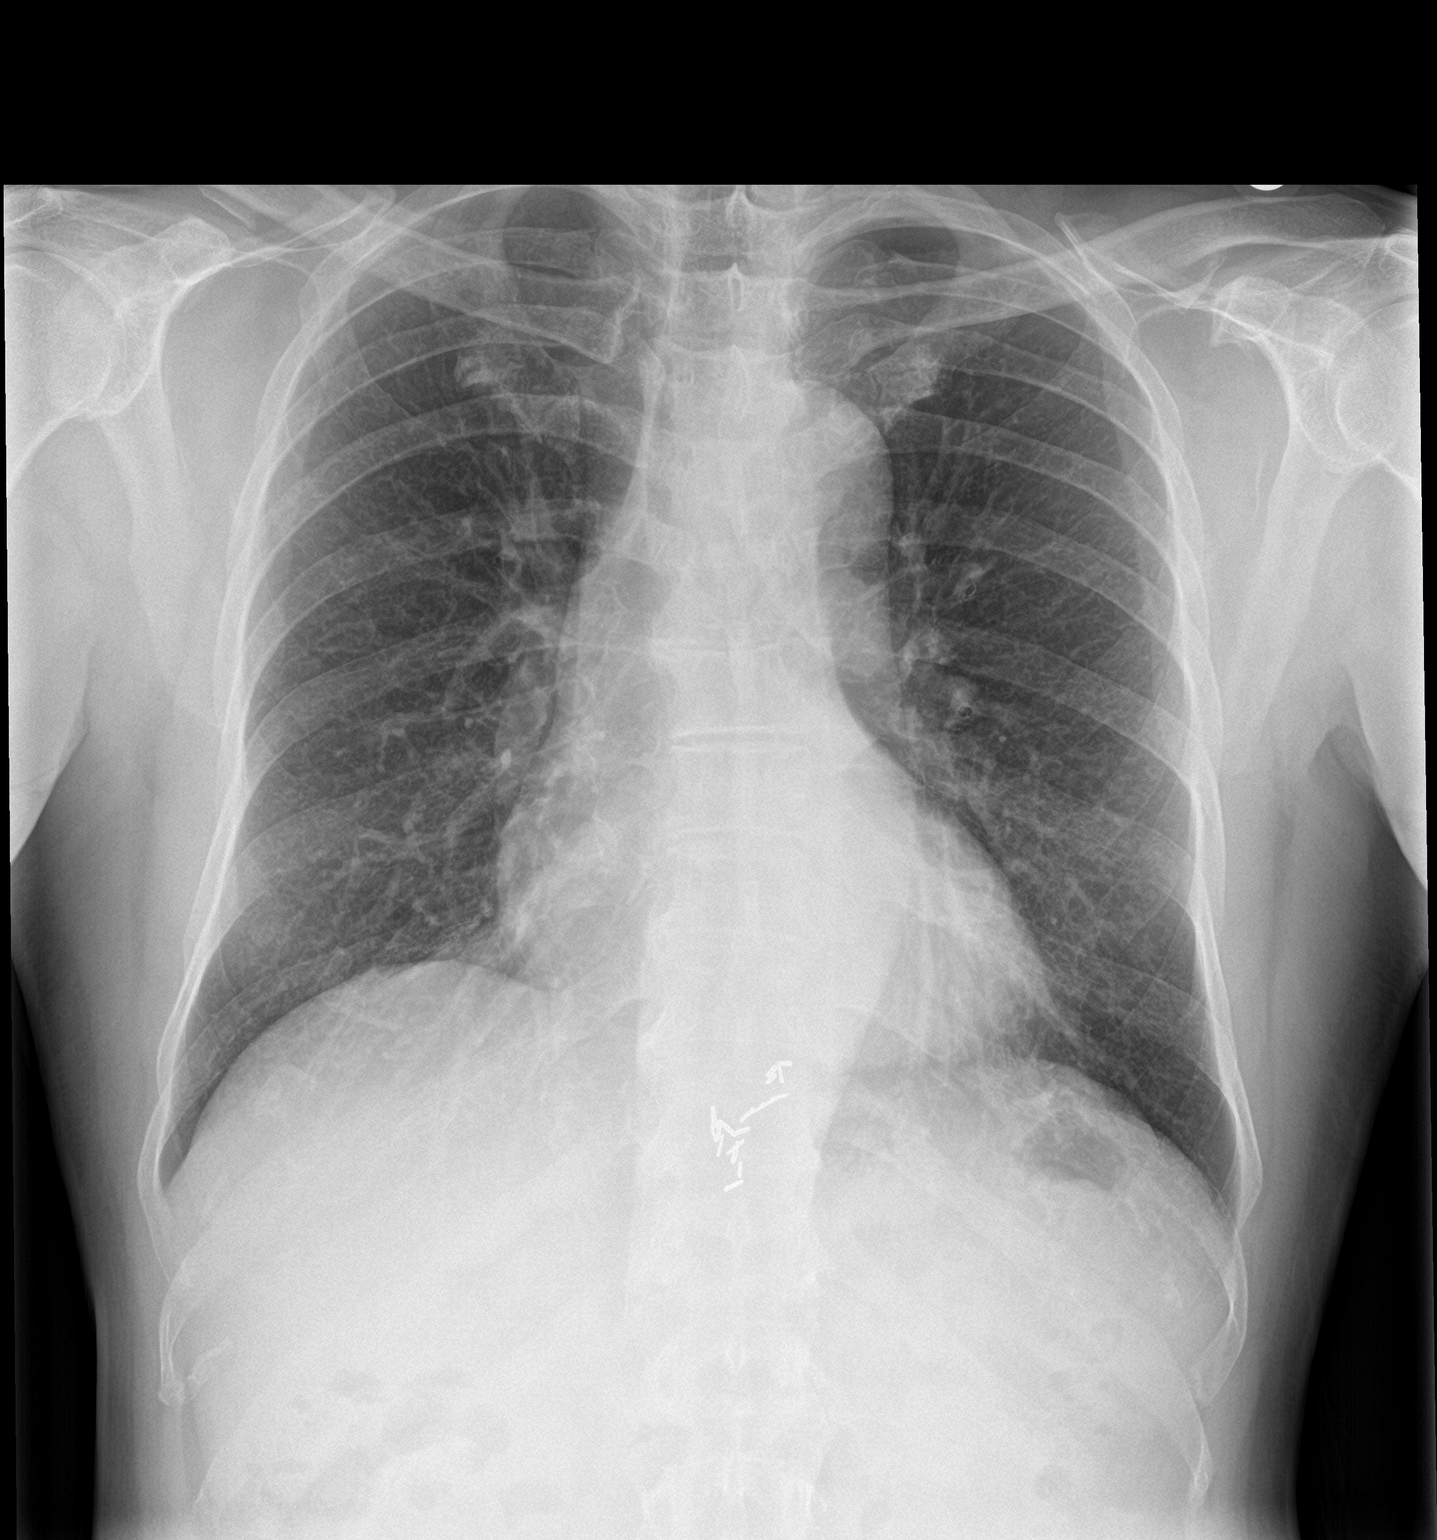

[chest lat]
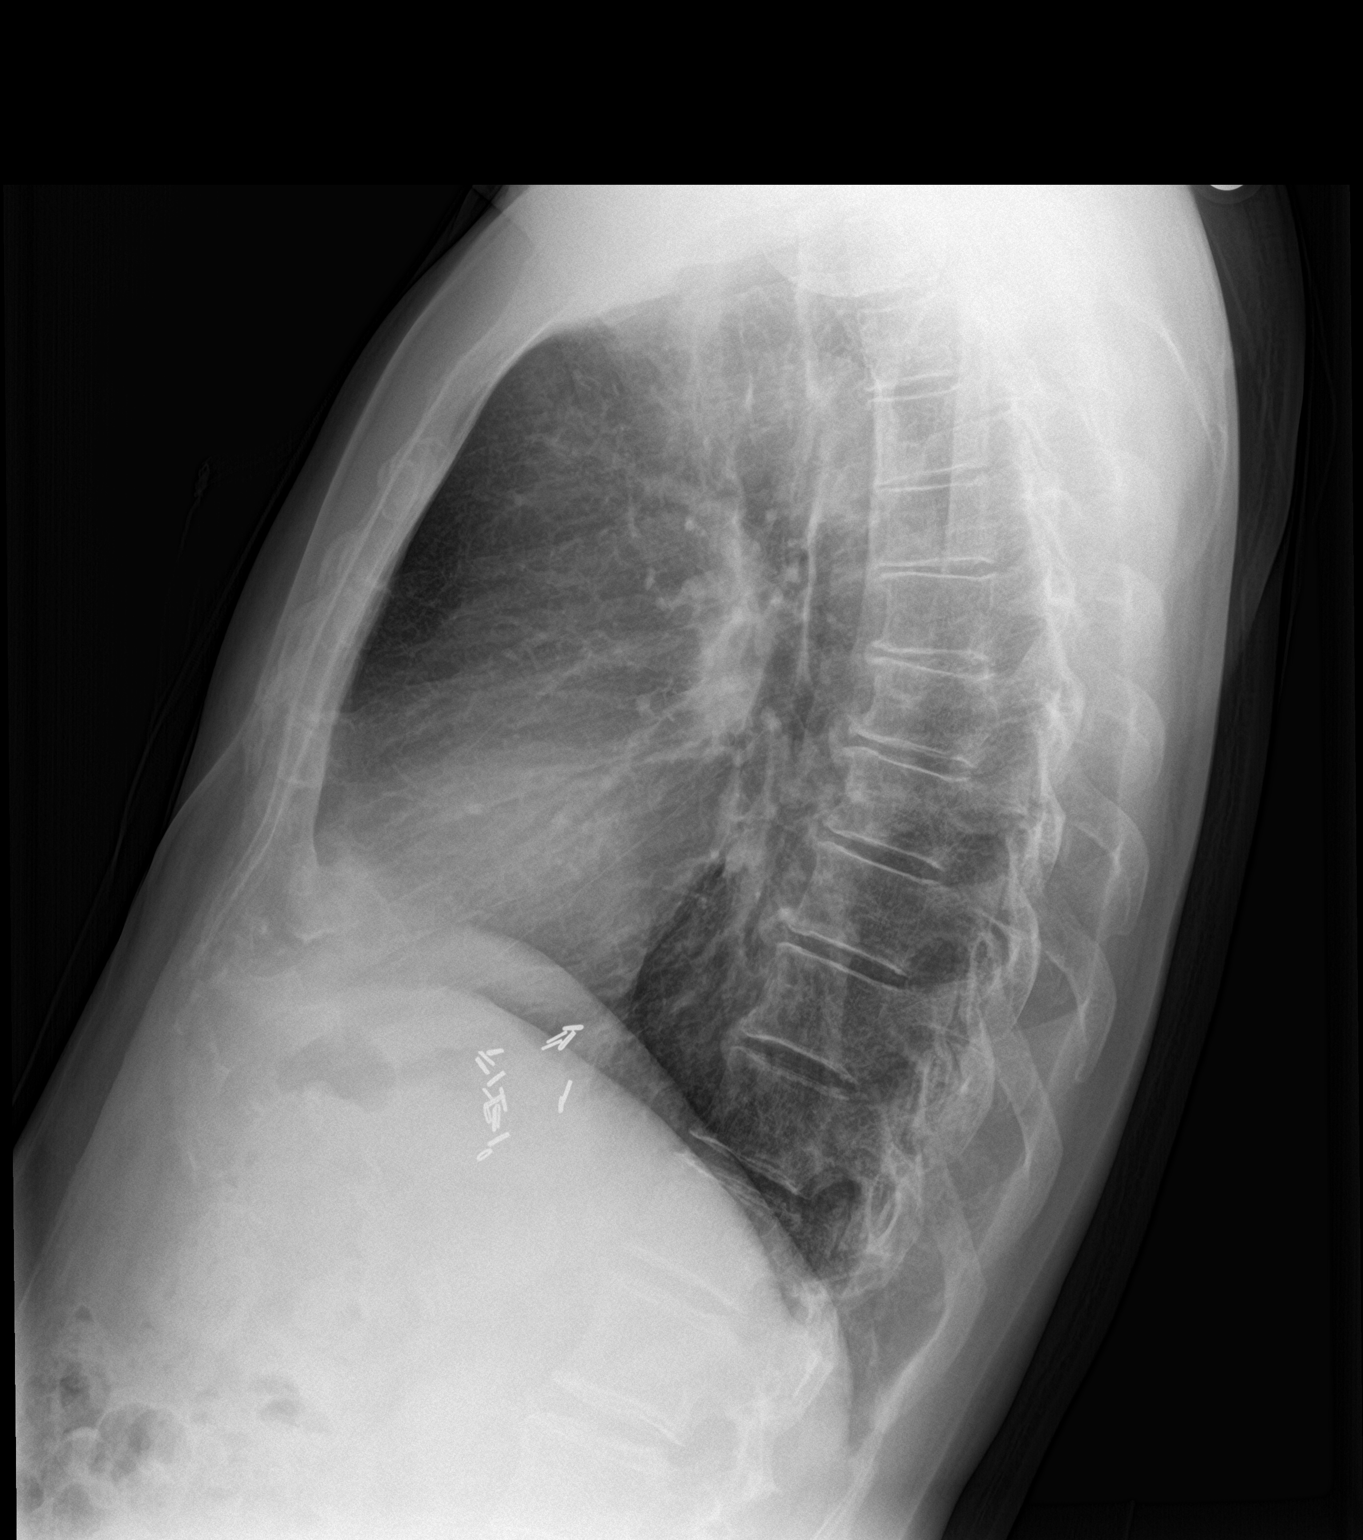

[2 of 2 positions shown; findings below may reference images not displayed]

FINDINGS: The lungs are well-expanded. There is no focal infiltrate. Specific
attention to the right middle lobe reveals no acute abnormality. The
heart and pulmonary vascularity are normal. The mediastinum is
normal in width. Nipple shadows are visible bilaterally. There
surgical clips in the gallbladder fossa. There is mild multilevel
degenerative disc disease of the thoracic spine.
IMPRESSION: There is no pneumonia nor other acute cardiopulmonary abnormality.

## 2019-02-15 ENCOUNTER — Other Ambulatory Visit: Payer: Self-pay | Admitting: Family Medicine

## 2019-02-15 DIAGNOSIS — I10 Essential (primary) hypertension: Secondary | ICD-10-CM

## 2019-06-09 ENCOUNTER — Ambulatory Visit: Payer: Medicare HMO | Admitting: Family Medicine

## 2019-06-10 ENCOUNTER — Other Ambulatory Visit: Payer: Self-pay | Admitting: Family Medicine

## 2019-06-10 ENCOUNTER — Ambulatory Visit: Payer: Medicare HMO | Admitting: Family Medicine

## 2019-06-10 DIAGNOSIS — I10 Essential (primary) hypertension: Secondary | ICD-10-CM | POA: Diagnosis not present

## 2019-06-10 DIAGNOSIS — E785 Hyperlipidemia, unspecified: Secondary | ICD-10-CM | POA: Diagnosis not present

## 2019-06-10 DIAGNOSIS — Z125 Encounter for screening for malignant neoplasm of prostate: Secondary | ICD-10-CM | POA: Diagnosis not present

## 2019-06-11 LAB — COMPLETE METABOLIC PANEL WITH GFR
AG Ratio: 1.3 (calc) (ref 1.0–2.5)
ALT: 15 U/L (ref 9–46)
AST: 22 U/L (ref 10–35)
Albumin: 4.3 g/dL (ref 3.6–5.1)
Alkaline phosphatase (APISO): 61 U/L (ref 35–144)
BUN: 11 mg/dL (ref 7–25)
CO2: 29 mmol/L (ref 20–32)
Calcium: 9.6 mg/dL (ref 8.6–10.3)
Chloride: 104 mmol/L (ref 98–110)
Creat: 0.99 mg/dL (ref 0.70–1.18)
GFR, Est African American: 88 mL/min/{1.73_m2} (ref >=60)
GFR, Est Non African American: 76 mL/min/{1.73_m2} (ref >=60)
Globulin: 3.3 g/dL (calc) (ref 1.9–3.7)
Glucose, Bld: 135 mg/dL — ABNORMAL HIGH (ref 65–99)
Potassium: 4.4 mmol/L (ref 3.5–5.3)
Sodium: 139 mmol/L (ref 135–146)
Total Bilirubin: 0.5 mg/dL (ref 0.2–1.2)
Total Protein: 7.6 g/dL (ref 6.1–8.1)

## 2019-06-11 LAB — CBC
HCT: 49.9 % (ref 38.5–50.0)
Hemoglobin: 17.1 g/dL (ref 13.2–17.1)
MCH: 30.8 pg (ref 27.0–33.0)
MCHC: 34.3 g/dL (ref 32.0–36.0)
MCV: 89.9 fL (ref 80.0–100.0)
MPV: 12.1 fL (ref 7.5–12.5)
Platelets: 155 10*3/uL (ref 140–400)
RBC: 5.55 10*6/uL (ref 4.20–5.80)
RDW: 12.9 % (ref 11.0–15.0)
WBC: 5.7 10*3/uL (ref 3.8–10.8)

## 2019-06-11 LAB — PSA: PSA: 0.9 ng/mL (ref <=4.0)

## 2019-06-11 LAB — LIPID PANEL
Cholesterol: 193 mg/dL (ref <200)
HDL: 44 mg/dL (ref >=40)
LDL Cholesterol (Calc): 121 mg/dL (calc) — ABNORMAL HIGH
Non-HDL Cholesterol (Calc): 149 mg/dL (calc) — ABNORMAL HIGH (ref <130)
Total CHOL/HDL Ratio: 4.4 (calc) (ref <5.0)
Triglycerides: 164 mg/dL — ABNORMAL HIGH (ref <150)

## 2019-06-16 NOTE — Progress Notes (Signed)
Subjective:   Angel Krueger is a 73 y.o. male who presents for Medicare Annual/Subsequent preventive examination.  Review of Systems:  No ROS.  Medicare Wellness Virtual Visit.  Visual/audio telehealth visit, UTA vital signs.   See social history for additional risk factors.       Sleep patterns: Getting 8 hours of sleep a night. No waking up during the night to void. Wakes up feeling ready for the day- refreshed Home Safety/Smoke Alarms: Feels safe in home. Smoke alarms in place.  Living environment;.Lives with wife in 1 story home. No steps.Shower is a step over tub/shower. No slid mat in place in the tub. Seat Belt Safety/Bike Helmet: Wears seat belt.   Male:   CCS-  UTD    PSA-  UTD Lab Results  Component Value Date   PSA 0.9 06/10/2019   PSA 1.0 12/17/2017   PSA 1.24 02/29/2016        Objective:    Vitals: There were no vitals taken for this visit.  There is no height or weight on file to calculate BMI.  Advanced Directives 06/21/2019  Does Patient Have a Medical Advance Directive? No  Would patient like information on creating a medical advance directive? No - Patient declined    Tobacco Social History   Tobacco Use  Smoking Status Never Smoker  Smokeless Tobacco Never Used     Counseling given: Not Answered   Clinical Intake:  Pre-visit preparation completed: Yes  Pain : No/denies pain     Nutritional Risks: None Diabetes: No  How often do you need to have someone help you when you read instructions, pamphlets, or other written materials from your doctor or pharmacy?: 1 - Never What is the last grade level you completed in school?: 13  Interpreter Needed?: No  Information entered by :: Corrin ParkerKim ,LPN  Past Medical History:  Diagnosis Date  . Generalized headaches    Past Surgical History:  Procedure Laterality Date  . COLON SURGERY     Unknown specifics   History reviewed. No pertinent family history. Social History    Socioeconomic History  . Marital status: Married    Spouse name: remilila  . Number of children: 2  . Years of education: 4213  . Highest education level: Some college, no degree  Occupational History  . Occupation: [employer]    Comment: retired  Engineer, productionocial Needs  . Financial resource strain: Not hard at all  . Food insecurity    Worry: Never true    Inability: Never true  . Transportation needs    Medical: No    Non-medical: No  Tobacco Use  . Smoking status: Never Smoker  . Smokeless tobacco: Never Used  Substance and Sexual Activity  . Alcohol use: Not Currently  . Drug use: No  . Sexual activity: Yes  Lifestyle  . Physical activity    Days per week: 4 days    Minutes per session: 40 min  . Stress: Not at all  Relationships  . Social connections    Talks on phone: More than three times a week    Gets together: Three times a week    Attends religious service: More than 4 times per year    Active member of club or organization: No    Attends meetings of clubs or organizations: Never    Relationship status: Married  Other Topics Concern  . Not on file  Social History Narrative   Work In the garden   Clean yard. Walks  now since gym isn't open    Outpatient Encounter Medications as of 06/21/2019  Medication Sig  . aspirin EC 81 MG tablet Take 1 tablet (81 mg total) by mouth daily.  Marland Kitchen. BRIMONIDINE TARTRATE OP Apply to eye.  . dorzolamide (TRUSOPT) 2 % ophthalmic solution 1 drop 3 (three) times daily.  . hydrochlorothiazide (HYDRODIURIL) 12.5 MG tablet TAKE 1 TABLET (12.5 MG TOTAL) BY MOUTH DAILY.  Marland Kitchen. LATANOPROST OP Apply to eye.  . [DISCONTINUED] azithromycin (ZITHROMAX Z-PAK) 250 MG tablet Take 2 tablets (500 mg) on  Day 1,  followed by 1 tablet (250 mg) once daily on Days 2 through 5.  . [DISCONTINUED] benzonatate (TESSALON) 200 MG capsule Take 1 capsule (200 mg total) by mouth 3 (three) times daily as needed for cough.  . [DISCONTINUED] predniSONE (DELTASONE) 50 MG  tablet Take 1 tablet (50 mg total) by mouth daily.   No facility-administered encounter medications on file as of 06/21/2019.     Activities of Daily Living No flowsheet data found.  Patient Care Team: Rodolph Bongorey, Evan S, MD as PCP - General (Family Medicine)   Assessment:   This is a routine wellness examination for [place].Physical assessment deferred to PCP.   Exercise Activities and Dietary recommendations   Diet eats a healthy diet eats mostly vegetarian. Breakfast:cereal with banana and juice Lunch: sandwich or cooks Dinner: eats vegetables and rice. Drinks drinks water every 2 glasses.      Goals    . Exercise 150 min/wk Moderate Activity       Fall Risk Fall Risk  06/21/2019 12/17/2017 12/17/2017 01/27/2015  Falls in the past year? 0 No No No  Injury with Fall? 0 - - -  Follow up Falls prevention discussed - - -   Is the patient's home free of loose throw rugs in walkways, pet beds, electrical cords, etc?   yes      Grab bars in the bathroom? no      Handrails on the stairs?   yes      Adequate lighting?   yes   Depression Screen PHQ 2/9 Scores 06/21/2019 12/17/2017 12/17/2017 02/29/2016  PHQ - 2 Score 1 0 0 0    Cognitive Function     6CIT Screen 06/21/2019  What time? 0 points  Count back from 20 0 points  Months in reverse 2 points  Repeat phrase 0 points    Immunization History  Administered Date(s) Administered  . Hepatitis A, Adult 09/09/2016  . Influenza Whole 09/08/2005  . Influenza,inj,Quad PF,6+ Mos 09/09/2016  . Influenza-Unspecified 08/01/2015, 09/22/2017  . Pneumococcal Conjugate-13 01/27/2015  . Pneumococcal Polysaccharide-23 02/15/2013  . Tdap 02/03/2013  . Typhoid Live 07/24/2015  . Zoster 08/01/2015    Screening Tests Health Maintenance  Topic Date Due  . MAMMOGRAM  09/19/1997  . DEXA SCAN  09/19/2012  . INFLUENZA VACCINE  07/10/2019  . TETANUS/TDAP  02/03/2023  . COLONOSCOPY  04/17/2023  . Hepatitis C Screening  Completed  . PNA vac  Low Risk Adult  Completed        Plan:  Please schedule your next medicare wellness visit with me in 1 yr.  Mr. Allena Katzatel , Thank you for taking time to come for your Medicare Wellness Visit. I appreciate your ongoing commitment to your health goals. Please review the following plan we discussed and let me know if I can assist you in the future.   These are the goals we discussed: Goals    . Exercise 150 min/wk Moderate  Activity       This is a list of the screening recommended for you and due dates:  Health Maintenance  Topic Date Due  . Mammogram  09/19/1997  . DEXA scan (bone density measurement)  09/19/2012  . Flu Shot  07/10/2019  . Tetanus Vaccine  02/03/2023  . Colon Cancer Screening  04/17/2023  .  Hepatitis C: One time screening is recommended by Center for Disease Control  (CDC) for  adults born from [birth year] through 1964.   Completed  . Pneumonia vaccines  Completed     I have personally reviewed and noted the following in the patient's chart:   . Medical and social history . Use of alcohol, tobacco or illicit drugs  . Current medications and supplements . Functional ability and status . Nutritional status . Physical activity . Advanced directives . List of other physicians . Hospitalizations, surgeries, and ER visits in previous 12 months . Vitals . Screenings to include cognitive, depression, and falls . Referrals and appointments  In addition, I have reviewed and discussed with patient certain preventive protocols, quality metrics, and best practice recommendations. A written personalized care plan for preventive services as well as general preventive health recommendations were provided to patient.     Joanne Chars, LPN  [id number]

## 2019-06-21 ENCOUNTER — Ambulatory Visit (INDEPENDENT_AMBULATORY_CARE_PROVIDER_SITE_OTHER): Payer: Medicare HMO | Admitting: *Deleted

## 2019-06-21 DIAGNOSIS — Z0000 Encounter for general adult medical examination without abnormal findings: Secondary | ICD-10-CM | POA: Diagnosis not present

## 2019-06-21 NOTE — Patient Instructions (Addendum)
Please schedule your next medicare wellness visit with me in 1 yr.  Angel Krueger , Thank you for taking time to come for your Medicare Wellness Visit. I appreciate your ongoing commitment to your health goals. Please review the following plan we discussed and let me know if I can assist you in the future.  Please schedule your next medicare wellness visit with me in 1 yr. These are the goals we discussed: Goals    . Exercise 150 min/wk Moderate Activity

## 2019-06-22 DIAGNOSIS — Z961 Presence of intraocular lens: Secondary | ICD-10-CM | POA: Diagnosis not present

## 2019-06-22 DIAGNOSIS — H402213 Chronic angle-closure glaucoma, right eye, severe stage: Secondary | ICD-10-CM | POA: Diagnosis not present

## 2019-06-22 DIAGNOSIS — H402222 Chronic angle-closure glaucoma, left eye, moderate stage: Secondary | ICD-10-CM | POA: Diagnosis not present

## 2019-09-22 ENCOUNTER — Other Ambulatory Visit: Payer: Self-pay | Admitting: Family Medicine

## 2019-09-22 DIAGNOSIS — I10 Essential (primary) hypertension: Secondary | ICD-10-CM

## 2019-09-24 ENCOUNTER — Telehealth: Payer: Self-pay | Admitting: Family Medicine

## 2019-09-24 NOTE — Telephone Encounter (Signed)
Pt and his wife advised.

## 2019-09-24 NOTE — Telephone Encounter (Signed)
Received refill request for hydrochlorothiazide.  I went ahead and sent a refill into the pharmacy.  Recommend schedule appointment with new PCP in the near future.  Recommend visit within 3 to 6 months.

## 2019-10-08 ENCOUNTER — Other Ambulatory Visit: Payer: Self-pay

## 2019-10-08 ENCOUNTER — Ambulatory Visit (INDEPENDENT_AMBULATORY_CARE_PROVIDER_SITE_OTHER): Payer: Medicare HMO | Admitting: Osteopathic Medicine

## 2019-10-08 VITALS — BP 176/91 | HR 75 | Temp 98.2°F | Wt 160.1 lb

## 2019-10-08 DIAGNOSIS — I10 Essential (primary) hypertension: Secondary | ICD-10-CM

## 2019-10-08 DIAGNOSIS — Z20822 Contact with and (suspected) exposure to covid-19: Secondary | ICD-10-CM

## 2019-10-08 DIAGNOSIS — Z20828 Contact with and (suspected) exposure to other viral communicable diseases: Secondary | ICD-10-CM

## 2019-10-08 DIAGNOSIS — R52 Pain, unspecified: Secondary | ICD-10-CM | POA: Diagnosis not present

## 2019-10-08 MED ORDER — HYDROCHLOROTHIAZIDE 12.5 MG PO TABS: 25.0000 mg | ORAL_TABLET | Freq: Every day | ORAL | 0 refills | Status: DC

## 2019-10-08 MED ORDER — PREDNISONE 10 MG PO TABS: 10.0000 mg | ORAL_TABLET | Freq: Two times a day (BID) | ORAL | 0 refills | Status: DC

## 2019-10-08 MED ORDER — KETOROLAC TROMETHAMINE 30 MG/ML IJ SOLN
30.0000 mg | Freq: Once | INTRAMUSCULAR | Status: AC
  Administered 2019-10-08: 16:00:00 30 mg via INTRAMUSCULAR

## 2019-10-08 NOTE — Patient Instructions (Addendum)
Plan:  I am worried about coronavirus infection. Body aches and feeling tired are sometimes the only symptoms. Please watch out for symptoms of fever, trouble breathing, cough. Please STAY AT [place] at least until test results return, hopefully in 2 days. Likely will need to isolate for 10 days total, depending on symptoms.   I am worried about high blood pressure. I am not sure if it is high because of the muscle pain or because of something else. I think we should increase your blood pressure medicine from 12.5 mg to 25 mg daily, and check blood work. Please check your blood pressure at home 2 or 3 times per day and write down the numbers so we can review them next week.   We have given a shot of Toradol today to help pain.   Will let you know as soon as I have test results back!   Please go to the hospital if you develop severe trouble breathing or any other serious health concerns!

## 2019-10-08 NOTE — Progress Notes (Signed)
Pt had lab work drawn during office visit. Pt tolerated blood draw well on Left brachial area with no complications. Labs (2 SSTs & 1 lavender)  with requisition form sent to in office lab for processing.

## 2019-10-08 NOTE — Progress Notes (Signed)
HPI: Angel Krueger is a 73 y.o. male who  has a past medical history of Generalized headaches.  he presents to [facility] today, 10/08/19,  for chief complaint of:  Pains  Muscle pain . Context: no injury or recent illness . Location: from knees up to shoulders and neck  . Quality: soreness, aching  . Severity/Duration: 2 days  . Timing/Modifying factors: worse when he is active. Advil 200 mg q 6 hours not helping.   Assoc signs/symptoms: no headache, no fever, denies cough (though he is coughing slightly in the exam room...)   HTN:   BP elevated today, pt has not been in the office in about a year, though he did have medicare visit few months ago this summer. No recent BP on file, pt reports he has taken his meds as usual last night. Rerpots his home BP systolic is about 161W but better into 120s when he exercises  BP Readings from Last 3 Encounters:  10/08/19 (!) 176/91  10/02/18 123/75  08/07/18 (!) 154/88         At today's visit 10/08/19 ... PMH, PSH, FH reviewed and updated as needed.  Current medication list and allergy/intolerance hx reviewed and updated as needed. (See remainder of HPI, ROS, Phys Exam below)   No results found.  No results found for this or any previous visit (from the past 72 hour(s)).        ASSESSMENT/PLAN: The primary encounter diagnosis was Generalized body aches. Diagnoses of Suspected COVID-19 virus infection and Essential hypertension were also pertinent to this visit.   Appropriate PPE was worn for close contact with the patient by myself and the medical assistant, I supervised his self-swab   Orders Placed This Encounter  Procedures  . Novel Coronavirus, NAA ([facility])  . CBC with Differential/Platelet  . COMPLETE METABOLIC PANEL WITH GFR  . TSH  . CK     Meds ordered this encounter  Medications  . ketorolac (TORADOL) 30 MG/ML injection 30 mg    Patient Instructions  Plan:  I  am worried about coronavirus infection. Body aches and feeling tired are sometimes the only symptoms. Please watch out for symptoms of fever, trouble breathing, cough. Please STAY AT [place] at least until test results return, hopefully in 2 days. Likely will need to isolate for 10 days total, depending on symptoms.   I am worried about high blood pressure. I am not sure if it is high because of the muscle pain or because of something else. I think we should increase your blood pressure medicine from 12.5 mg to 25 mg daily, and check blood work. Please check your blood pressure at home 2 or 3 times per day and write down the numbers so we can review them next week.   We have given a shot of Toradol today to help pain.   Will let you know as soon as I have test results back!   Please go to the hospital if you develop severe trouble breathing or any other serious health concerns!           Follow-up plan: Return for RECHECK PENDING RESULTS / IF WORSE, AND VIRTUAL VISIT MONDAY OR TUESDAY TO MONITOR BLOOD PRESSURE .                                                 ################################################# ################################################# ################################################# #################################################  Current Meds  Medication Sig  . aspirin EC 81 MG tablet Take 1 tablet (81 mg total) by mouth daily.  Marland Kitchen BRIMONIDINE TARTRATE OP Apply to eye.  . dorzolamide (TRUSOPT) 2 % ophthalmic solution 1 drop 3 (three) times daily.  . hydrochlorothiazide (HYDRODIURIL) 12.5 MG tablet TAKE 1 TABLET EVERY DAY. APPOINTMENT NEEDED FOR FURTHER REFILLS  . LATANOPROST OP Apply to eye.    Allergies  Allergen Reactions  . Penicillins        Review of Systems:  Constitutional: No recent illness  HEENT: No  headache, no vision change  Cardiac: No  chest pain, No  pressure,  No palpitations  Respiratory:  No  shortness of breath. No  Cough  Gastrointestinal: No  abdominal pain, no change on bowel habits  Musculoskeletal: +new myalgia/arthralgia  Skin: No  Rash  Neurologic: No  weakness, No  Dizziness     Exam:  BP (!) 176/91 (BP Location: Right Arm, Patient Position: Sitting, Cuff Size: Normal)   Pulse 75   Temp 98.2 F (36.8 C) (Oral)   Wt 160 lb 1.9 oz (72.6 kg)   BMI 22.97 kg/m   Constitutional: VS see above. General Appearance: alert, well-developed, well-nourished, NAD  Eyes: Normal lids and conjunctive, non-icteric sclera  Ears, Nose, Mouth, Throat: MMM, Normal external inspection ears/nares/mouth/lips/gums.  Neck: No masses, trachea midline.   Respiratory: Normal respiratory effort.   Musculoskeletal: Gait normal. Symmetric and independent movement of all extremities  Neurological: Normal balance/coordination. No tremor.  Skin: warm, dry, intact.   Psychiatric: Normal judgment/insight. Normal mood and affect. Oriented x3.       Visit summary with medication list and pertinent instructions was printed for patient to review, patient was advised to alert [place] if any updates are needed. All questions at time of visit were answered - patient instructed to contact office with any additional concerns. ER/RTC precautions were reviewed with the patient and understanding verbalized.    Please note: voice recognition software was used to produce this document, and typos may escape review. Please contact Dr. Lyn Hollingshead for any needed clarifications.    Follow up plan: Return for RECHECK PENDING RESULTS / IF WORSE, AND VIRTUAL VISIT MONDAY OR TUESDAY TO MONITOR BLOOD PRESSURE .

## 2019-10-09 ENCOUNTER — Other Ambulatory Visit: Payer: Self-pay | Admitting: Family Medicine

## 2019-10-09 DIAGNOSIS — I10 Essential (primary) hypertension: Secondary | ICD-10-CM

## 2019-10-09 LAB — COMPLETE METABOLIC PANEL WITH GFR
AG Ratio: 1.3 (calc) (ref 1.0–2.5)
ALT: 18 U/L (ref 9–46)
AST: 24 U/L (ref 10–35)
Albumin: 4.2 g/dL (ref 3.6–5.1)
Alkaline phosphatase (APISO): 67 U/L (ref 35–144)
BUN: 13 mg/dL (ref 7–25)
CO2: 30 mmol/L (ref 20–32)
Calcium: 9.4 mg/dL (ref 8.6–10.3)
Chloride: 102 mmol/L (ref 98–110)
Creat: 1 mg/dL (ref 0.70–1.18)
GFR, Est African American: 87 mL/min/{1.73_m2} (ref >=60)
GFR, Est Non African American: 75 mL/min/{1.73_m2} (ref >=60)
Globulin: 3.3 g/dL (calc) (ref 1.9–3.7)
Glucose, Bld: 127 mg/dL — ABNORMAL HIGH (ref 65–99)
Potassium: 4.9 mmol/L (ref 3.5–5.3)
Sodium: 137 mmol/L (ref 135–146)
Total Bilirubin: 0.5 mg/dL (ref 0.2–1.2)
Total Protein: 7.5 g/dL (ref 6.1–8.1)

## 2019-10-09 LAB — CBC WITH DIFFERENTIAL/PLATELET
Absolute Monocytes: 891 cells/uL (ref 200–950)
Basophils Absolute: 33 cells/uL (ref 0–200)
Basophils Relative: 0.5 %
Eosinophils Absolute: 33 cells/uL (ref 15–500)
Eosinophils Relative: 0.5 %
HCT: 50.9 % — ABNORMAL HIGH (ref 38.5–50.0)
Hemoglobin: 17.7 g/dL — ABNORMAL HIGH (ref 13.2–17.1)
Lymphs Abs: 1109 cells/uL (ref 850–3900)
MCH: 30.4 pg (ref 27.0–33.0)
MCHC: 34.8 g/dL (ref 32.0–36.0)
MCV: 87.3 fL (ref 80.0–100.0)
MPV: 12.9 fL — ABNORMAL HIGH (ref 7.5–12.5)
Monocytes Relative: 13.5 %
Neutro Abs: 4534 cells/uL (ref 1500–7800)
Neutrophils Relative %: 68.7 %
Platelets: 137 10*3/uL — ABNORMAL LOW (ref 140–400)
RBC: 5.83 10*6/uL — ABNORMAL HIGH (ref 4.20–5.80)
RDW: 12.9 % (ref 11.0–15.0)
Total Lymphocyte: 16.8 %
WBC: 6.6 10*3/uL (ref 3.8–10.8)

## 2019-10-09 LAB — TSH: TSH: 2.4 mIU/L (ref 0.40–4.50)

## 2019-10-09 LAB — CK: Total CK: 55 U/L (ref 44–196)

## 2019-10-10 LAB — SPECIMEN STATUS REPORT

## 2019-10-10 LAB — NOVEL CORONAVIRUS, NAA: SARS-CoV-2, NAA: NOT DETECTED

## 2019-10-18 ENCOUNTER — Telehealth: Payer: Self-pay | Admitting: Family Medicine

## 2019-10-18 NOTE — Telephone Encounter (Signed)
Wife has been made aware of recommendations and appointment has been made with Dr.Thekkekandam. Wife wanted to know what patient can take in the mean time for pain. Patient was transferred to [facility].

## 2019-10-18 NOTE — Telephone Encounter (Signed)
OK to take tylenol/acetaminophen 500 - 1000 mg up to 4 times per day, can add motrin/ibuprofen 400 - 600 mg up to 4 times per day

## 2019-10-18 NOTE — Telephone Encounter (Signed)
Wife called in stating that they need more of that predniSONE (DELTASONE) 10 MG tablet [[id number]]. He only had a 5 day supply. Still having body aches. Please advise.

## 2019-10-18 NOTE — Telephone Encounter (Signed)
Patient is scheduled for appt with sports med tomorrow. Wife states that he is in a lot of pain, wanting to know what he can take OTC for the pain. Please advise

## 2019-10-18 NOTE — Telephone Encounter (Signed)
I am not sure that continuing to give him multiple rounds of prednisone is a good idea.  Since he is having some nonspecific musculoskeletal pain, might be a good idea for him to follow-up with sports medicine since all of his labs came back normal.

## 2019-10-18 NOTE — Telephone Encounter (Signed)
Pt's wife advised.

## 2019-10-19 ENCOUNTER — Other Ambulatory Visit: Payer: Self-pay

## 2019-10-19 ENCOUNTER — Encounter: Payer: Self-pay | Admitting: Sports Medicine

## 2019-10-19 ENCOUNTER — Ambulatory Visit (INDEPENDENT_AMBULATORY_CARE_PROVIDER_SITE_OTHER): Payer: Medicare HMO

## 2019-10-19 ENCOUNTER — Ambulatory Visit (INDEPENDENT_AMBULATORY_CARE_PROVIDER_SITE_OTHER): Payer: Medicare HMO | Admitting: Sports Medicine

## 2019-10-19 DIAGNOSIS — M542 Cervicalgia: Secondary | ICD-10-CM | POA: Diagnosis not present

## 2019-10-19 DIAGNOSIS — M48061 Spinal stenosis, lumbar region without neurogenic claudication: Secondary | ICD-10-CM

## 2019-10-19 DIAGNOSIS — M47814 Spondylosis without myelopathy or radiculopathy, thoracic region: Secondary | ICD-10-CM | POA: Diagnosis not present

## 2019-10-19 DIAGNOSIS — M545 Low back pain: Secondary | ICD-10-CM | POA: Diagnosis not present

## 2019-10-19 DIAGNOSIS — M47812 Spondylosis without myelopathy or radiculopathy, cervical region: Secondary | ICD-10-CM | POA: Diagnosis not present

## 2019-10-19 MED ORDER — MELOXICAM 15 MG PO TABS: ORAL_TABLET | ORAL | 3 refills | Status: DC

## 2019-10-19 MED ORDER — PREDNISONE 50 MG PO TABS: ORAL_TABLET | ORAL | 0 refills | Status: DC

## 2019-10-19 NOTE — Progress Notes (Signed)
Subjective:    CC: Back pain  HPI: This is a pleasant 73 year old male, for the past several months he has had pain in his low back with radiation down both legs, thighs, buttocks.  No numbness and tingling, no progressive weakness, no constitutional symptoms, pain is worse when laying in bed at night.  No history of cancer.  I reviewed the past medical history, family history, social history, surgical history, and allergies today and no changes were needed.  Please see the problem list section below in epic for further details.  Past Medical History: Past Medical History:  Diagnosis Date  . Generalized headaches    Past Surgical History: Past Surgical History:  Procedure Laterality Date  . COLON SURGERY     Unknown specifics   Social History: Social History   Socioeconomic History  . Marital status: Married    Spouse name: remilila  . Number of children: 2  . Years of education: 46  . Highest education level: Some college, no degree  Occupational History  . 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 Financial resource strain: Not hard at all  . Food insecurity    Worry: Never true    Inability: Never true  . Transportation needs    Medical: No    Non-medical: No  Tobacco Use  . Smoking status: Never Smoker  . Smokeless tobacco: Never Used  Substance and Sexual Activity  . Alcohol use: Not Currently  . Drug use: No  . Sexual activity: Yes  Lifestyle  . Physical activity    Days per week: 4 days    Minutes per session: 40 min  . Stress: Not at all  Relationships  . Social connections    Talks on phone: More than three times a week    Gets together: Three times a week    Attends religious service: More than 4 times per year    Active member of club or organization: No    Attends meetings of clubs or organizations: Never    Relationship status: Married  Other Topics Concern  . Not on file  Social History Narrative   Work In the garden   Clean yard. Walks now since gym isn't open   Family History: No family history on file. Allergies: Allergies  Allergen Reactions  . Penicillins    Medications: See med rec.  Review of Systems: No fevers, chills, night sweats, weight loss, chest pain, or shortness of breath.   Objective:    General: Well Developed, well nourished, and in no acute distress.  Neuro: Alert and oriented x3, extra-ocular muscles intact, sensation grossly intact.  HEENT: Normocephalic, atraumatic, pupils equal round reactive to light, neck supple, no masses, no lymphadenopathy, thyroid nonpalpable.  Skin: Warm and dry, no rashes. Cardiac: Regular rate and rhythm, no murmurs rubs or gallops, no lower extremity edema.  Respiratory: Clear to auscultation bilaterally. Not using accessory muscles, speaking in full sentences. Back Exam:  Inspection: Unremarkable  Motion: Flexion 45 deg, Extension 45 deg, Side Bending to 45 deg bilaterally,  Rotation to 45 deg bilaterally  SLR laying: Negative  XSLR laying: Negative  Palpable tenderness: Tender to palpation along the cervical, thoracic, and lumbar spine.Marland Kitchen FABER: negative. Sensory change: Gross sensation intact to all lumbar and sacral dermatomes.  Reflexes: 2+ at both patellar tendons, 2+ at achilles tendons, Babinski's downgoing.  Strength at foot  Plantar-flexion: 5/5 Dorsi-flexion: 5/5 Eversion: 5/5 Inversion: 5/5  Leg strength  Quad: 5/5 Hamstring: 5/5  Hip flexor: 5/5 Hip abductors: 5/5  Gait unremarkable.  Impression and Recommendations:    Lumbar spinal stenosis Axial back pain worse with laying in bed, consistent with spinal stenosis, radiation down into the buttocks and thighs. Started conservatively, x-rays, prednisone, meloxicam, formal physical therapy. Return to see me in 4 to 6 weeks, MR for interventional planning if no better.   ___________________________________________ Ihor Austin. Benjamin Stain, M.D., ABFM., CAQSM. Primary Care and  Sports Medicine [facility]  Adjunct Professor of Family Medicine  [employer]

## 2019-10-19 NOTE — Assessment & Plan Note (Signed)
Axial back pain worse with laying in bed, consistent with spinal stenosis, radiation down into the buttocks and thighs. Started conservatively, x-rays, prednisone, meloxicam, formal physical therapy. Return to see me in 4 to 6 weeks, MR for interventional planning if no better.

## 2019-10-19 NOTE — Progress Notes (Signed)
15

## 2019-10-29 ENCOUNTER — Ambulatory Visit: Payer: Medicare HMO | Admitting: Physical Therapy

## 2019-11-11 ENCOUNTER — Telehealth: Payer: Self-pay | Admitting: Sports Medicine

## 2019-11-11 NOTE — Telephone Encounter (Signed)
Patient wife called and he is having some fever and body aches. I have the patient an appointment for 11/15/2019.  The patient and wife were advised to go to [facility] or ED if symptoms became worse. No other questions.

## 2019-11-15 ENCOUNTER — Ambulatory Visit (INDEPENDENT_AMBULATORY_CARE_PROVIDER_SITE_OTHER): Payer: Medicare HMO | Admitting: Sports Medicine

## 2019-11-15 ENCOUNTER — Encounter: Payer: Self-pay | Admitting: Sports Medicine

## 2019-11-15 DIAGNOSIS — K5900 Constipation, unspecified: Secondary | ICD-10-CM

## 2019-11-15 DIAGNOSIS — M48061 Spinal stenosis, lumbar region without neurogenic claudication: Secondary | ICD-10-CM | POA: Diagnosis not present

## 2019-11-15 MED ORDER — POLYETHYLENE GLYCOL 3350 17 G PO PACK: 17.0000 g | PACK | Freq: Two times a day (BID) | ORAL | 11 refills | Status: DC

## 2019-11-15 NOTE — Assessment & Plan Note (Signed)
Pain is overall much better. His son called him and second burst of prednisone, and he takes only an occasional meloxicam and as well. Never did formal physical therapy. He was worried that it would hurt his back. He can continue meloxicam as needed, adding home rehab exercises, he can return to see me in 4 to 6 weeks if needed.

## 2019-11-15 NOTE — Progress Notes (Signed)
Subjective:    CC: Low back pain  HPI: This is a pleasant 73 year old male, we have been treating him for low back pain, symptomatology consistent with lumbar spinal stenosis, he did well after the burst of prednisone and with occasional meloxicam, and never did physical therapy.  Ultimately he had a recurrence of pain over the last month, his son is a physician and called him in an additional burst of prednisone and he is doing well now.  He is interested in a noninvasive, noninterventional approach.  Angel Krueger is also having some difficulty passing stool, no melena, hematochezia, nausea, vomiting.  Symptoms are moderate, persistent.  I reviewed the past medical history, family history, social history, surgical history, and allergies today and no changes were needed.  Please see the problem list section below in epic for further details.  Past Medical History: Past Medical History:  Diagnosis Date  . Generalized headaches    Past Surgical History: Past Surgical History:  Procedure Laterality Date  . COLON SURGERY     Unknown specifics   Social History: Social History   Socioeconomic History  . Marital status: Married    Spouse name: remilila  . Number of children: 2  . Years of education: 90  . Highest education level: Some college, no degree  Occupational History  . Occupation: Statistician    Comment: retired  Engineer, production  . Financial resource strain: Not hard at all  . Food insecurity    Worry: Never true    Inability: Never true  . Transportation needs    Medical: No    Non-medical: No  Tobacco Use  . Smoking status: Never Smoker  . Smokeless tobacco: Never Used  Substance and Sexual Activity  . Alcohol use: Not Currently  . Drug use: No  . Sexual activity: Yes  Lifestyle  . Physical activity    Days per week: 4 days    Minutes per session: 40 min  . Stress: Not at all  Relationships  . Social connections    Talks on phone: More than three times a week    Gets  together: Three times a week    Attends religious service: More than 4 times per year    Active member of club or organization: No    Attends meetings of clubs or organizations: Never    Relationship status: Married  Other Topics Concern  . Not on file  Social History Narrative   Work In the garden   Clean yard. Walks now since gym isn't open   Family History: No family history on file. Allergies: Allergies  Allergen Reactions  . Penicillins    Medications: See med rec.  Review of Systems: No fevers, chills, night sweats, weight loss, chest pain, or shortness of breath.   Objective:    General: Well Developed, well nourished, and in no acute distress.  Neuro: Alert and oriented x3, extra-ocular muscles intact, sensation grossly intact.  HEENT: Normocephalic, atraumatic, pupils equal round reactive to light, neck supple, no masses, no lymphadenopathy, thyroid nonpalpable.  Skin: Warm and dry, no rashes. Cardiac: Regular rate and rhythm, no murmurs rubs or gallops, no lower extremity edema.  Respiratory: Clear to auscultation bilaterally. Not using accessory muscles, speaking in full sentences.  Impression and Recommendations:    Lumbar spinal stenosis Pain is overall much better. His son called him and second burst of prednisone, and he takes only an occasional meloxicam and as well. Never did formal physical therapy. He was worried that it  would hurt his back. He can continue meloxicam as needed, adding home rehab exercises, he can return to see me in 4 to 6 weeks if needed.  Constipation Adding MiraLAX, he will start a high-fiber diet.   ___________________________________________ Gwen Her. Dianah Field, M.D., ABFM., CAQSM. Primary Care and Sports Medicine [facility]  Adjunct Professor of [employer] [facility] [employer]

## 2019-11-15 NOTE — Assessment & Plan Note (Signed)
Adding MiraLAX, he will start a high-fiber diet.

## 2019-11-18 ENCOUNTER — Encounter: Payer: Self-pay | Admitting: Physician Assistant

## 2019-11-18 ENCOUNTER — Ambulatory Visit (INDEPENDENT_AMBULATORY_CARE_PROVIDER_SITE_OTHER): Payer: Medicare HMO | Admitting: Physician Assistant

## 2019-11-18 VITALS — HR 73 | Temp 98.6°F | Resp 18 | Ht 67.0 in | Wt 158.0 lb

## 2019-11-18 DIAGNOSIS — Z20822 Contact with and (suspected) exposure to covid-19: Secondary | ICD-10-CM

## 2019-11-18 DIAGNOSIS — D696 Thrombocytopenia, unspecified: Secondary | ICD-10-CM

## 2019-11-18 DIAGNOSIS — M791 Myalgia, unspecified site: Secondary | ICD-10-CM

## 2019-11-18 DIAGNOSIS — R531 Weakness: Secondary | ICD-10-CM

## 2019-11-18 DIAGNOSIS — M2550 Pain in unspecified joint: Secondary | ICD-10-CM | POA: Diagnosis not present

## 2019-11-18 DIAGNOSIS — Z20828 Contact with and (suspected) exposure to other viral communicable diseases: Secondary | ICD-10-CM | POA: Diagnosis not present

## 2019-11-18 DIAGNOSIS — D582 Other hemoglobinopathies: Secondary | ICD-10-CM | POA: Diagnosis not present

## 2019-11-18 DIAGNOSIS — R5383 Other fatigue: Secondary | ICD-10-CM | POA: Insufficient documentation

## 2019-11-18 DIAGNOSIS — Z209 Contact with and (suspected) exposure to unspecified communicable disease: Secondary | ICD-10-CM | POA: Diagnosis not present

## 2019-11-18 MED ORDER — HYDROCODONE-ACETAMINOPHEN 5-325 MG PO TABS: 1.0000 | ORAL_TABLET | Freq: Three times a day (TID) | ORAL | 0 refills | Status: DC | PRN

## 2019-11-18 MED ORDER — DEXAMETHASONE 4 MG PO TABS: 4.0000 mg | ORAL_TABLET | Freq: Three times a day (TID) | ORAL | 0 refills | Status: DC

## 2019-11-18 NOTE — Assessment & Plan Note (Signed)
Polyarthralgias, widespread aches and pains and fatigue, wife with Covid. He is currently being tested so he will need to quarantine. When he is either recovered or has a negative test he can return for full rheumatoid testing. Also recommended Decadron to be taken for 5 days.

## 2019-11-18 NOTE — Progress Notes (Signed)
HPI:                                                                Angel Krueger is a 73 y.o. male who presents to [facility] today for "body aches and weakness"  Patient was initially scheduled for telehealth, but was evaluated both in-person in his private vehicle and by telephone  Pleasant 34 YOM with PMH of TIA, HTN, multilevel OA/DDD of  presents today with persistent muscle aches over his entire body, waxing and waning for 2-3 weeks. He describes feeling very weak and lacks energy. Pain is rated 8-9 in severity, waxing and waning, not localized or worse in any one area. Joints are not swollen or tender. Denies focal weakness, headache, paresthesias. Not relieved by Meloxicam or Tylenol. He stopped both medications due to lack of efficacy. He reports while he was on the Prednisone burst his pain was relieved, but pain has recurred.  In terms of work-up, he had labs on 10/30 which showed normal CK level. He did have an elevated Hgb and mild thrombocytopenia. X-rays showed multi-level DDD  He has a nonproductive cough that is chronic, unchanged. Denies fever, loss of appetite or constitutional symptoms. Denies SOB, chest pain. Denies loss of taste or smell.  Known COVID-19 exposure Wife began having symptoms on 12/03, confirmed positive COVID-19 infection last Friday  He was taking Miralax for constipation, but states his bowels are moving normally again.    Past Medical History:  Diagnosis Date  . Generalized headaches    Past Surgical History:  Procedure Laterality Date  . COLON SURGERY     Unknown specifics   Social History   Tobacco Use  . Smoking status: Never Smoker  . Smokeless tobacco: Never Used  Substance Use Topics  . Alcohol use: Not Currently   family history is not on file.    ROS: negative except as noted in the HPI  Medications: Current Outpatient Medications  Medication Sig Dispense Refill  .  aspirin EC 81 MG tablet Take 1 tablet (81 mg total) by mouth daily. 150 tablet 2  . BRIMONIDINE TARTRATE OP Apply to eye.    . dorzolamide (TRUSOPT) 2 % ophthalmic solution 1 drop 3 (three) times daily.    . hydrochlorothiazide (HYDRODIURIL) 12.5 MG tablet TAKE 1 TABLET EVERY DAY. APPOINTMENT NEEDED FOR FURTHER REFILLS 90 tablet 0  . LATANOPROST OP Apply to eye.    . meloxicam (MOBIC) 15 MG tablet One tab PO qAM with breakfast for 2 weeks, then daily prn pain. 30 tablet 3  . polyethylene glycol (MIRALAX / GLYCOLAX) 17 g packet Take 17 g by mouth 2 (two) times daily. Until stooling regularly 30 packet 11   No current facility-administered medications for this visit.   Allergies  Allergen Reactions  . Penicillins        Objective:  Pulse 73   Temp 98.6 F (37 C)   Resp 18   Ht 5\' 7"  (1.702 m)   Wt 158 lb (71.7 kg)   SpO2 96%   BMI 24.75 kg/m   Physical exam limited by drive-up exam in private vehicle Gen:  alert, not ill-appearing, no distress, appropriate for age HEENT: head normocephalic without obvious abnormality, conjunctiva and cornea clear,  trachea midline Pulm: Normal work of breathing, normal phonation Skin: intact, no rashes on exposed skin, no jaundice, no cyanosis Mental status: well-groomed, cooperative, good eye contact, euthymic mood, affect mood-congruent, speech is articulate, and thought processes clear and goal-directed  Labs Lab Results  Component Value Date   CKTOTAL 55 10/08/2019   No results found for: ESRSEDRATE, POCTSEDRATE No results found for: CRP Lab Results  Component Value Date   CREATININE 1.00 10/08/2019   BUN 13 10/08/2019   NA 137 10/08/2019   K 4.9 10/08/2019   CL 102 10/08/2019   CO2 30 10/08/2019   No results found for: RF Lab Results  Component Value Date   WBC 6.6 10/08/2019   HGB 17.7 (H) 10/08/2019   HCT 50.9 (H) 10/08/2019   MCV 87.3 10/08/2019   PLT 137 (L) 10/08/2019     No results found for this or any previous  visit (from the past 72 hour(s)). No results found.    Assessment and Plan: 73 y.o. male with   .Diagnoses and all orders for this visit:  Cough with exposure to COVID-19 virus -     Novel Coronavirus, NAA ([facility])  Polyarthralgia -     CBC with Differential -     Comprehensive metabolic panel -     Rheumatoid Arthritis Diagnostic Panel, Comprehensive -     Uric acid -     Sedimentation rate -     ANA, IFA Comprehensive Panel -     CK -     dexamethasone (DECADRON) 4 MG tablet; Take 1 tablet (4 mg total) by mouth 3 (three) times daily. -     HYDROcodone-acetaminophen (NORCO/VICODIN) 5-325 MG tablet; Take 1 tablet by mouth every 8 (eight) hours as needed for moderate pain.  Generalized muscle ache  Generalized weakness     1. Exposure to COVID-19 Household contact developed symptoms on 12/03 (wife) If COVID positive, will need to isolate 10 days. He is not sure when his symptoms started so isolation start date will be today 12/10 If COVID negative, will need to quarantine until 12/20 (starting 1 week from wife's isolation end date)  2. Myalgias Widespread, poorly localized, 8-9/10 in severity, not relieved by NSAID/Tylenol, waxing and waning x 2-3 weeks Normal CK level 5 weeks ago Discussed with his Sports Med physician, who will manage (see note) COVID test pending   Patient education and anticipatory guidance given Patient agrees with treatment plan Follow-up as needed if symptoms worsen or fail to improve  Levonne Hubert PA-C

## 2019-11-19 ENCOUNTER — Telehealth: Payer: Medicare HMO | Admitting: Emergency Medicine

## 2019-11-19 ENCOUNTER — Telehealth: Payer: Self-pay | Admitting: Infectious Diseases

## 2019-11-19 DIAGNOSIS — R059 Cough, unspecified: Secondary | ICD-10-CM

## 2019-11-19 DIAGNOSIS — Z20822 Contact with and (suspected) exposure to covid-19: Secondary | ICD-10-CM

## 2019-11-19 DIAGNOSIS — Z20828 Contact with and (suspected) exposure to other viral communicable diseases: Secondary | ICD-10-CM

## 2019-11-19 DIAGNOSIS — R05 Cough: Secondary | ICD-10-CM

## 2019-11-19 LAB — NOVEL CORONAVIRUS, NAA: SARS-CoV-2, NAA: DETECTED — AB

## 2019-11-19 MED ORDER — BENZONATATE 100 MG PO CAPS: 100.0000 mg | ORAL_CAPSULE | Freq: Two times a day (BID) | ORAL | 0 refills | Status: DC | PRN

## 2019-11-19 NOTE — Telephone Encounter (Signed)
Discussed with patient about Covid symptoms and the use of bamlanivimab, a monoclonal antibody infusion for those with mild to moderate Covid symptoms and at a high risk of hospitalization.   Pt first had symptoms > 10 days ago with start on 12/01 and was which would put him beyond the 10 days. Aside from fatigue and body aches (which have been going on for 2-3 weeks now) he has no symptoms.   He does not meet criteria for monoclonal antibody infusion at this time.   We discussed quarantine procedure and continuation of medical plan as set forth by Dr. Dianah Field. [name], MSN, NP-C [facility] [name] Pager: [phone] Office: [phone] [place]: [phone]

## 2019-11-19 NOTE — Progress Notes (Signed)
E-Visit for Cough  Your current symptoms could be consistent with the coronavirus.  Your test results from your visit yesterday have not yet resulted.  To the extent that we are able through an e-visit, we will treat your cough.  COVID antibody infusion treatments cannot be ordered through an e-visit.  It is unlikely that your doctor would be able to order this for your either.    If you have worsening symptoms, such as those listed below, or specifically shortness of breath, you should be seen at one of our emergency departments for further evaluation.    We are enrolling you in our MyChart Home Monitoring for COVID19 . Daily you will receive a questionnaire within the MyChart website. Our COVID 19 response team will be monitoring your responses daily.  Please quarantine yourself while awaiting your test results. If you develop fever/cough/breathlessness, please stay home for 10 days with improving symptoms and until you have had 24 hours of no fever (without taking a fever reducer).  You should wear a mask or cloth face covering over your nose and mouth if you must be around other people or animals, including pets (even at home). Try to stay at least 6 feet away from other people. This will protect the people around you.  Please continue good preventive care measures, including:  frequent hand-washing, avoid touching your face, cover coughs/sneezes, stay out of crowds and keep a 6 foot distance from others.  COVID-19 is a respiratory illness with symptoms that are similar to the flu. Symptoms are typically mild to moderate, but there have been cases of severe illness and death due to the virus.   The following symptoms may appear 2-14 days after exposure: . Fever . Cough . Shortness of breath or difficulty breathing . Chills . Repeated shaking with chills . Muscle pain . Headache . Sore throat . New loss of taste or smell . Fatigue . Congestion or runny nose . Nausea or  vomiting . Diarrhea  Go to the nearest hospital ED for assessment if fever/cough/breathlessness are severe or illness seems like a threat to life.  It is vitally important that if you feel that you have an infection such as this virus or any other virus that you stay home and away from places where you may spread it to others.  You should avoid contact with people age 20 and older.   You can use medication such as A prescription cough medication called Tessalon Perles 100 mg. You may take 1-2 capsules every 8 hours as needed for cough  You may also take acetaminophen (Tylenol) as needed for fever.  Reduce your risk of any infection by using the same precautions used for avoiding the common cold or flu:  Marland Kitchen Wash your hands often with soap and warm water for at least 20 seconds.  If soap and water are not readily available, use an alcohol-based hand sanitizer with at least 60% alcohol.  . If coughing or sneezing, cover your mouth and nose by coughing or sneezing into the elbow areas of your shirt or coat, into a tissue or into your sleeve (not your hands). . Avoid shaking hands with others and consider head nods or verbal greetings only. . Avoid touching your eyes, nose, or mouth with unwashed hands.  . Avoid close contact with people who are sick. . Avoid places or events with large numbers of people in one location, like concerts or sporting events. . Carefully consider travel plans you have or are  making. . If you are planning any travel outside or inside the [place], visit the CDC's Travelers' Health webpage for the latest health notices. . If you have some symptoms but not all symptoms, continue to monitor at home and seek medical attention if your symptoms worsen. . If you are having a medical emergency, call 911.  HOME CARE . Only take medications as instructed by your medical team. . Drink plenty of fluids and get plenty of rest. . A steam or ultrasonic humidifier can help if you have  congestion.   GET HELP RIGHT AWAY IF YOU HAVE EMERGENCY WARNING SIGNS** FOR COVID-19. If you or someone is showing any of these signs seek emergency medical care immediately. Call 911 or proceed to your closest emergency facility if: . You develop worsening high fever. . Trouble breathing . Bluish lips or face . Persistent pain or pressure in the chest . New confusion . Inability to wake or stay awake . You cough up blood. . Your symptoms become more severe  **This list is not all possible symptoms. Contact your medical provider for any symptoms that are sever or concerning to you.  MAKE SURE YOU   Understand these instructions.  Will watch your condition.  Will get help right away if you are not doing well or get worse.  Your e-visit answers were reviewed by a board certified advanced clinical practitioner to complete your personal care plan.  Depending on the condition, your plan could have included both over the counter or prescription medications.  If there is a problem please reply once you have received a response from your provider.  Your safety is important to [place].  If you have drug allergies check your prescription carefully.    You can use MyChart to ask questions about today's visit, request a non-urgent call back, or ask for a work or school excuse for 24 hours related to this e-Visit. If it has been greater than 24 hours you will need to follow up with your provider, or enter a new e-Visit to address those concerns. You will get an e-mail in the next two days asking about your experience.  I hope that your e-visit has been valuable and will speed your recovery. Thank you for using e-visits.   Greater than 5 minutes, yet less than 10 minutes was used in reviewing the patient's chart, questionnaire, prescribing medications, and documentation for this visit.

## 2019-11-20 DIAGNOSIS — Z79899 Other long term (current) drug therapy: Secondary | ICD-10-CM | POA: Diagnosis not present

## 2019-11-20 DIAGNOSIS — Z880 Allergy status to penicillin: Secondary | ICD-10-CM | POA: Diagnosis not present

## 2019-11-20 DIAGNOSIS — R05 Cough: Secondary | ICD-10-CM | POA: Diagnosis not present

## 2019-11-20 DIAGNOSIS — U071 COVID-19: Secondary | ICD-10-CM | POA: Diagnosis not present

## 2019-11-20 DIAGNOSIS — R0602 Shortness of breath: Secondary | ICD-10-CM | POA: Diagnosis not present

## 2019-11-20 DIAGNOSIS — Z5181 Encounter for therapeutic drug level monitoring: Secondary | ICD-10-CM | POA: Diagnosis not present

## 2019-11-20 DIAGNOSIS — Z79891 Long term (current) use of opiate analgesic: Secondary | ICD-10-CM | POA: Diagnosis not present

## 2019-11-30 ENCOUNTER — Ambulatory Visit: Payer: Medicare HMO | Admitting: Sports Medicine

## 2019-12-08 IMAGING — DX DG LUMBAR SPINE COMPLETE 4+V
5 series · 5 of 5 positions shown · non-contrast
Comparison: CT 02/09/2013

CLINICAL DATA: Diffuse spine pain

EXAM:
LUMBAR SPINE - COMPLETE 4+ VIEW

[l-spine ap]
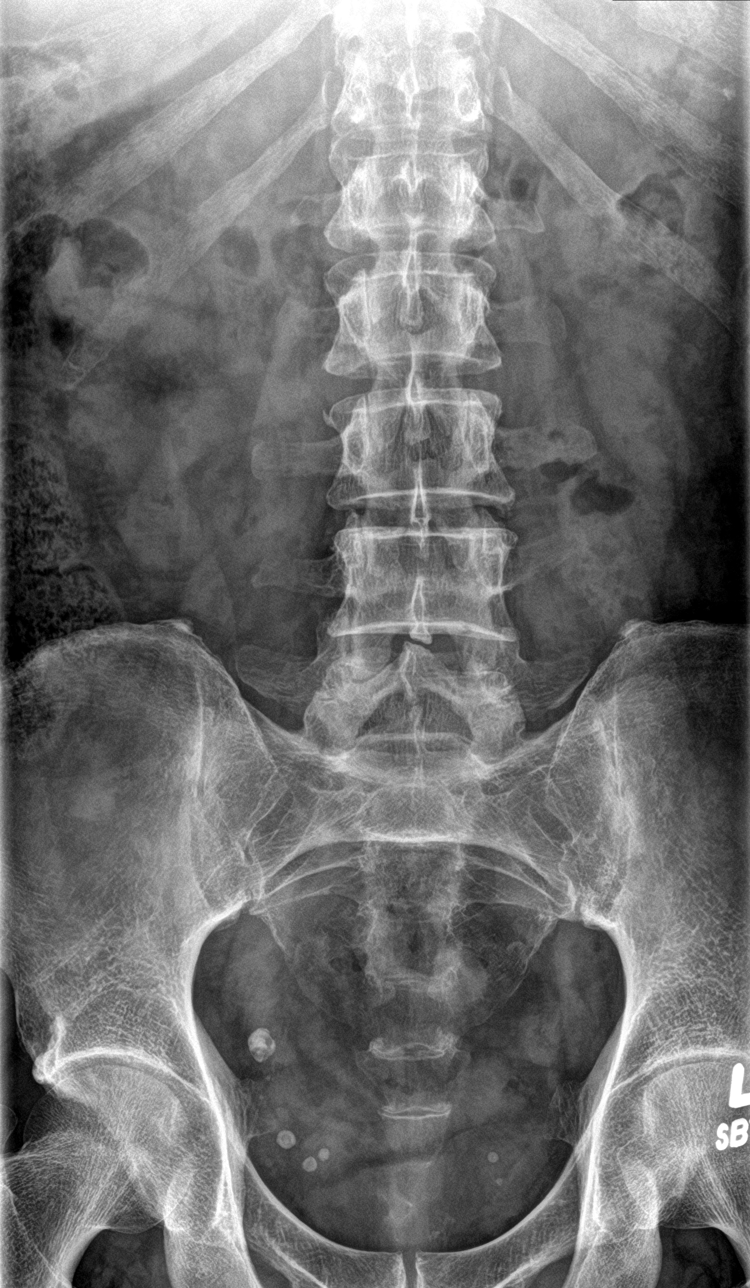

[l-spine obl (1 of 2)]
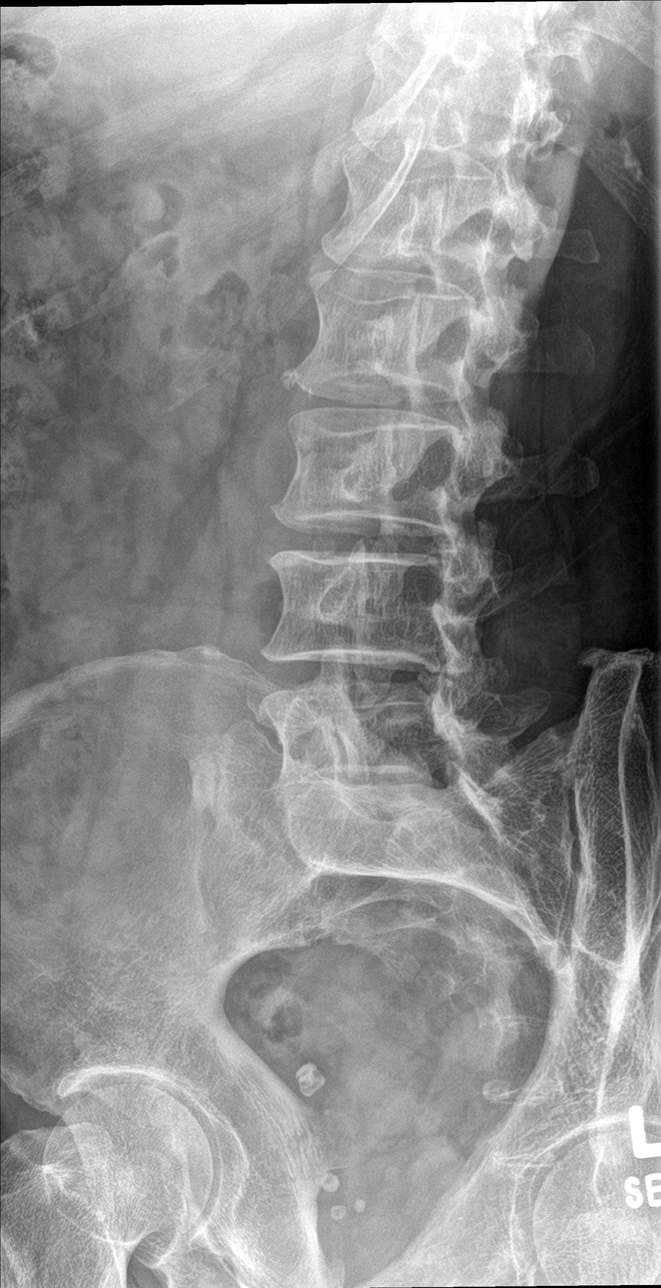

[l-spine obl (2 of 2)]
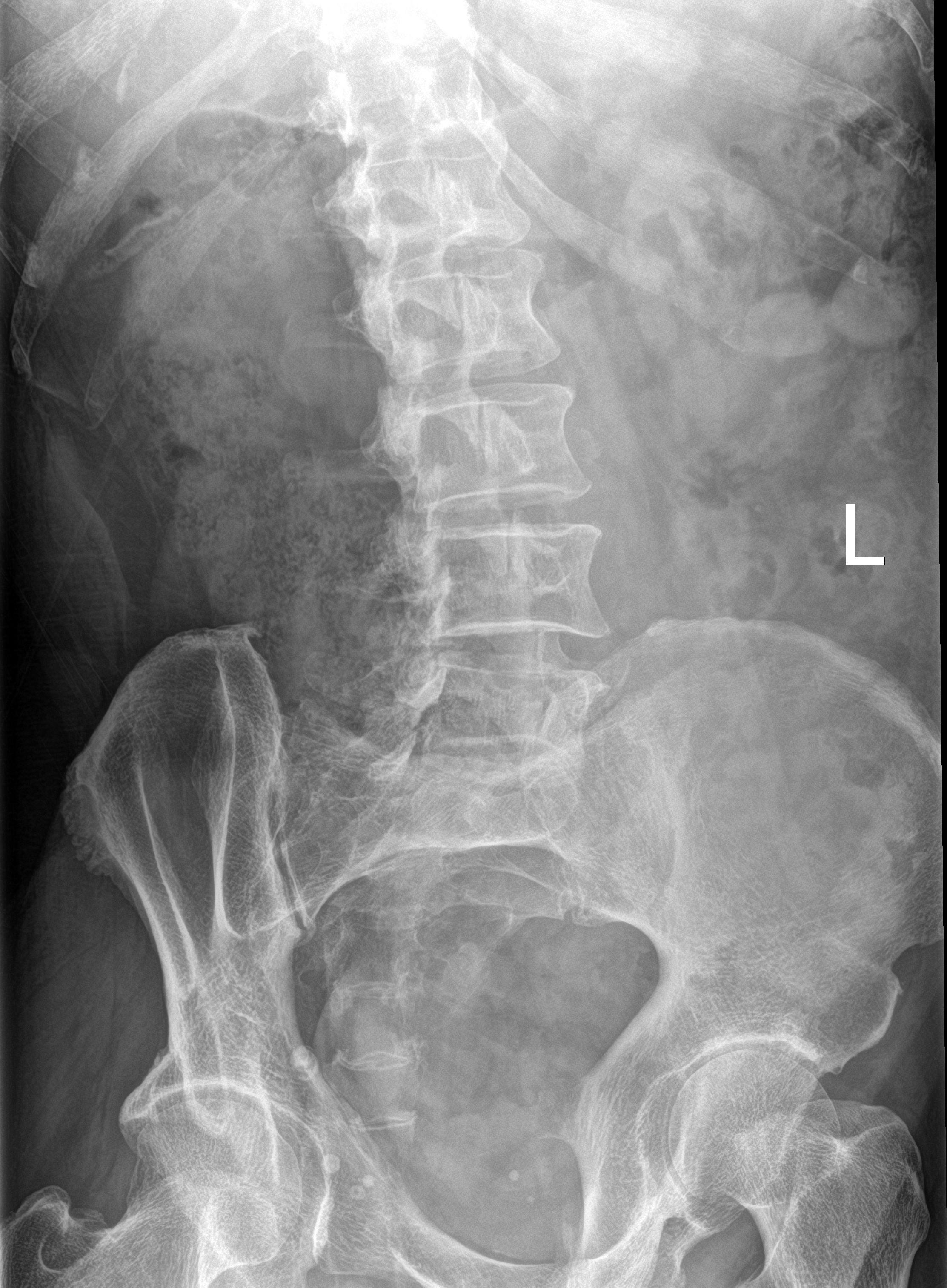

[l-spine lat]
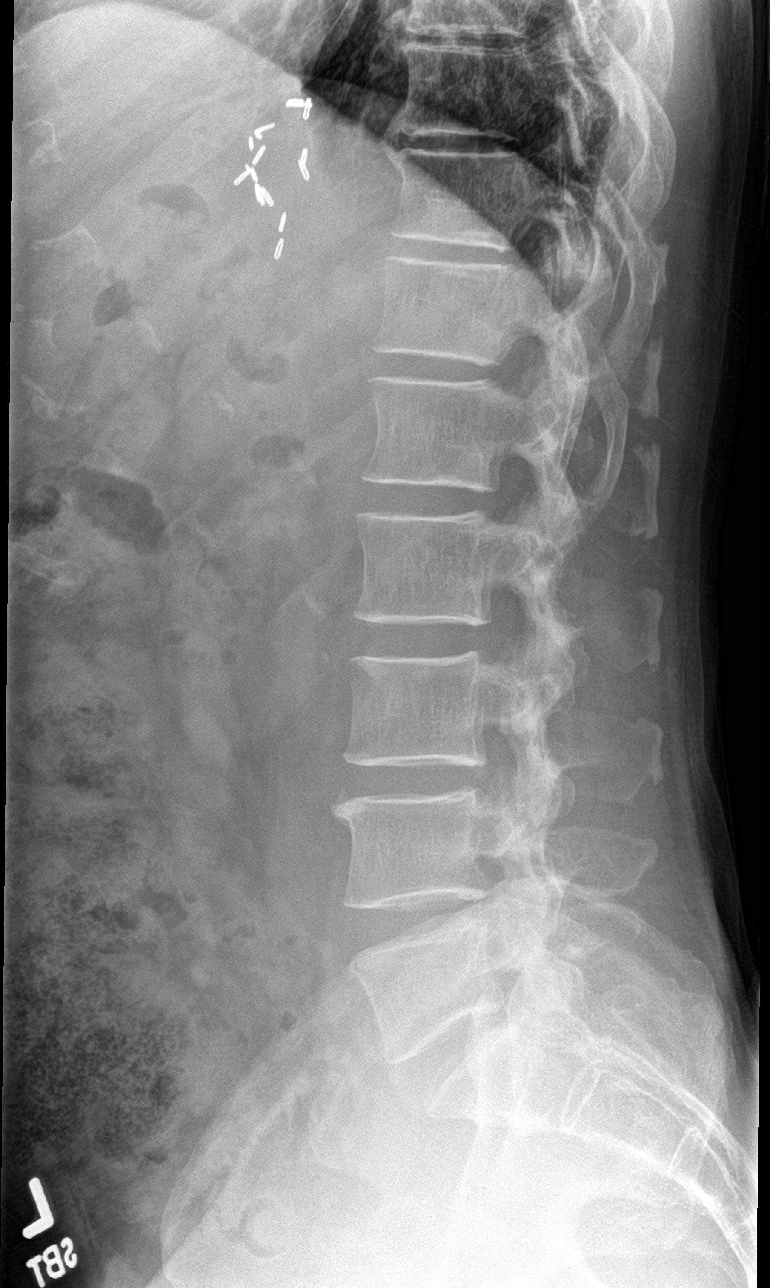

[l-spine spot]
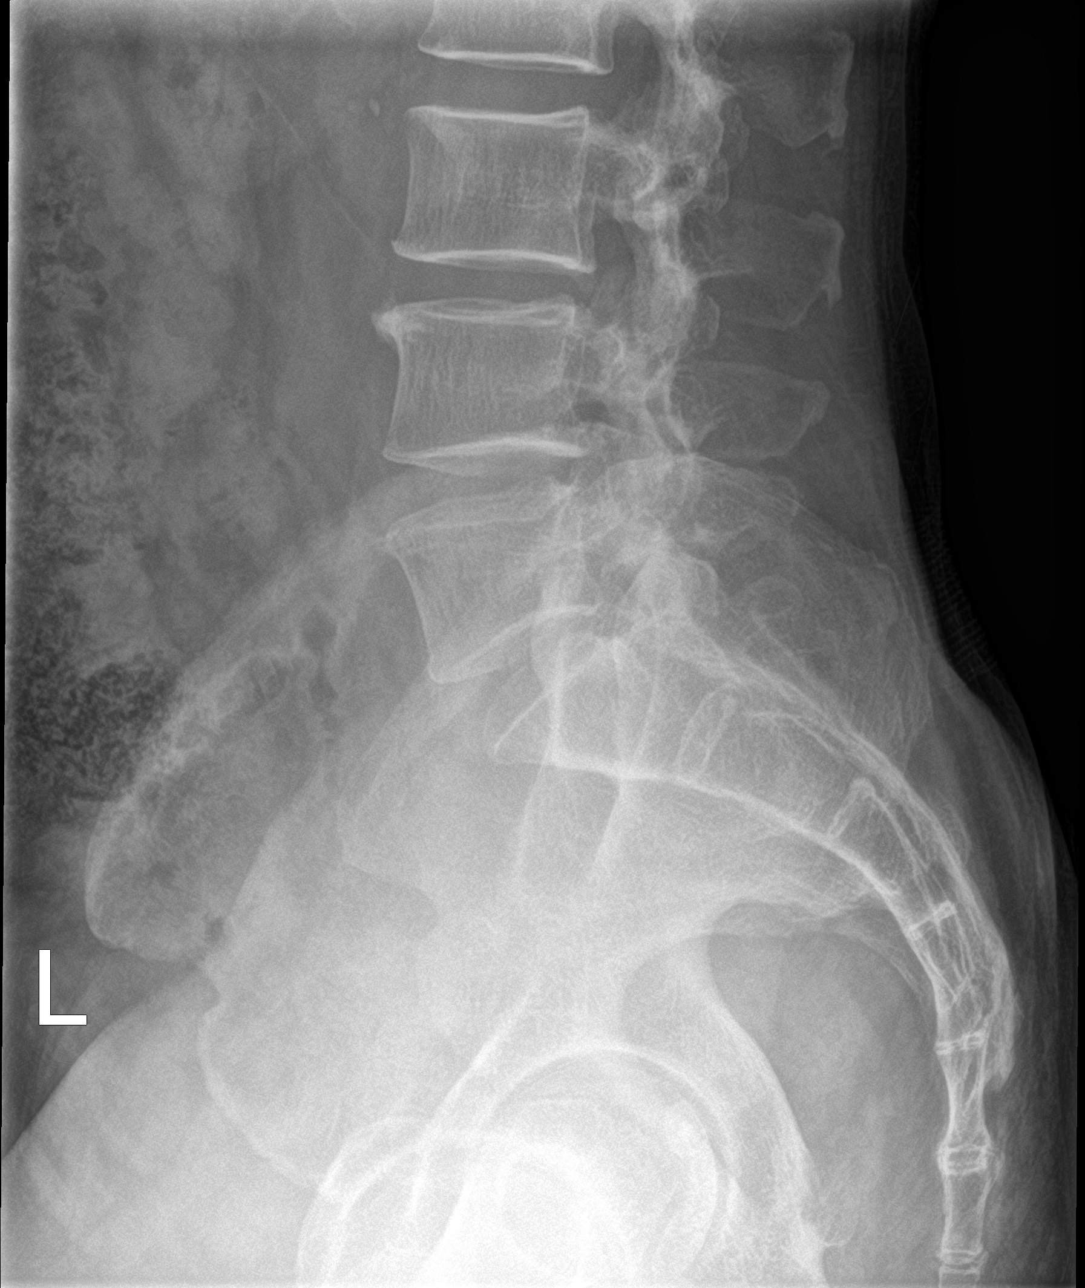

[5 of 5 positions shown; findings below may reference images not displayed]

FINDINGS: Five non rib-bearing lumbar type vertebra. Lumbar alignment within
normal limits. Vertebral body heights are maintained. Mild
osteophyte at L3-L4. Disc spaces are relatively maintained. Mild
posterior facet degenerative change of the lower lumbar spine
IMPRESSION: Mild degenerative changes.  No acute osseous abnormality.

## 2019-12-13 ENCOUNTER — Ambulatory Visit: Payer: Medicare HMO | Admitting: Sports Medicine

## 2020-01-11 ENCOUNTER — Encounter: Payer: Self-pay | Admitting: Family Medicine

## 2020-01-11 ENCOUNTER — Other Ambulatory Visit: Payer: Self-pay

## 2020-01-11 ENCOUNTER — Ambulatory Visit (INDEPENDENT_AMBULATORY_CARE_PROVIDER_SITE_OTHER): Payer: Medicare HMO | Admitting: Family Medicine

## 2020-01-11 VITALS — BP 142/92 | HR 92 | Temp 97.7°F | Ht 67.0 in | Wt 156.9 lb

## 2020-01-11 DIAGNOSIS — R29898 Other symptoms and signs involving the musculoskeletal system: Secondary | ICD-10-CM | POA: Diagnosis not present

## 2020-01-11 DIAGNOSIS — I10 Essential (primary) hypertension: Secondary | ICD-10-CM | POA: Diagnosis not present

## 2020-01-11 DIAGNOSIS — R202 Paresthesia of skin: Secondary | ICD-10-CM

## 2020-01-11 NOTE — Assessment & Plan Note (Addendum)
Weakness and paresthesias of L leg starting after antibody infusion of COVID-19.  ?mild transverse myelitis or GBS related to COVID   R leg weakness has recovered and L leg may still recover as well however I recommend that we get NCV w/ EMG.  Depending on results of this may benefit from PT.

## 2020-01-11 NOTE — Progress Notes (Signed)
Angel Krueger - 74 y.o. male MRN [id number]  Date of birth: 1946/01/19  Subjective Chief Complaint  Patient presents with  . Follow-up    HPI Angel Krueger is a 74 y.o. male with history of HTN and HLD here today for follow up and complaint of leg weakness and hip pain.    -Leg weakness:  He reports dx of COVID-19 in December 2020.  Had antibody infusion of Bamlanivimab completed at [facility].  He reports after infusion he had swelling of bilateral lower extremities with weakness and numbness.  The swelling has resolved and strength and sensation has returned to R leg however he has continue weakness and numbness in the left leg and foot.  He is walking with a cane due to this.  He denies falls.  He did twist his hip yesterday and is having some mild pain.  He denies associated back pain.    -HTN:   Current treatment with HCTZ.  Doing well with this.  He does check BP at home and readings are normal.  He denies chest pain, shortness of breath, palpitations or headache .  ROS:  A comprehensive ROS was completed and negative except as noted per HPI  Allergies  Allergen Reactions  . Penicillins     Past Medical History:  Diagnosis Date  . Generalized headaches     Past Surgical History:  Procedure Laterality Date  . COLON SURGERY     Unknown specifics    Social History   Socioeconomic History  . Marital status: Married    Spouse name: remilila  . Number of children: 2  . Years of education: 37  . Highest education level: Some college, no degree  Occupational History  . Occupation: Paediatric nurse    Comment: retired  Tobacco Use  . Smoking status: Never Smoker  . Smokeless tobacco: Never Used  Substance and Sexual Activity  . Alcohol use: Not Currently  . Drug use: No  . Sexual activity: Yes  Other Topics Concern  . Not on file  Social History Narrative   Work In the garden   Clean yard. Walks now since gym isn't open   Social Determinants of Health   Financial  Resource Strain: Low Risk   . Difficulty of Paying Living Expenses: Not hard at all  Food Insecurity: No Food Insecurity  . Worried About Charity fundraiser in the Last Year: Never true  . Ran Out of Food in the Last Year: Never true  Transportation Needs: No Transportation Needs  . Lack of Transportation (Medical): No  . Lack of Transportation (Non-Medical): No  Physical Activity: Sufficiently Active  . Days of Exercise per Week: 4 days  . Minutes of Exercise per Session: 40 min  Stress: No Stress Concern Present  . Feeling of Stress : Not at all  Social Connections: Slightly Isolated  . Frequency of Communication with Friends and Family: More than three times a week  . Frequency of Social Gatherings with Friends and Family: Three times a week  . Attends Religious Services: More than 4 times per year  . Active Member of Clubs or Organizations: No  . Attends Archivist Meetings: Never  . Marital Status: Married    No family history on file.  Health Maintenance  Topic Date Due  . TETANUS/TDAP  02/03/2023  . COLONOSCOPY  04/17/2023  . INFLUENZA VACCINE  Completed  . Hepatitis C Screening  Completed  . PNA vac Low Risk Adult  Completed    -----------------------------------------------------------------------------------------------------------------------------------------------------------------------------------------------------------------  Physical Exam BP (!) 142/92   Pulse 92   Temp 97.7 F (36.5 C) (Oral)   Ht 5\' 7"  (1.702 m)   Wt 156 lb 14.4 oz (71.2 kg)   BMI 24.57 kg/m   Physical Exam HENT:     Head: Normocephalic and atraumatic.     Mouth/Throat:     Mouth: Mucous membranes are moist.  Eyes:     General: No scleral icterus. Cardiovascular:     Rate and Rhythm: Normal rate and regular rhythm.  Pulmonary:     Effort: Pulmonary effort is normal.     Breath sounds: Normal breath sounds.  Skin:    General: Skin is warm and dry.  Neurological:      General: No focal deficit present.     Comments: Walks with cane, favoring R side.  Weakness in both dorsiflexion and plantar flexion in left foot.  Weakness in extension/flexion of lower leg.   Sensation deminished to light touch and sharp sensation.  He does have intact pressure sensation.    Psychiatric:        Mood and Affect: Mood normal.        Behavior: Behavior normal.     ------------------------------------------------------------------------------------------------------------------------------------------------------------------------------------------------------------------- Assessment and Plan  HTN (hypertension) BP is well controlled at this time continue current dose of HCTZ.   Weakness of left leg Weakness and paresthesias of L leg starting after antibody infusion of COVID-19.  ?mild transverse myelitis or GBS related to COVID   R leg weakness has recovered and L leg may still recover as well however I recommend that we get NCV w/ EMG.  Depending on results of this may benefit from PT.      This visit occurred during the SARS-CoV-2 public health emergency.  Safety protocols were in place, including screening questions prior to the visit, additional usage of staff PPE, and extensive cleaning of exam room while observing appropriate contact time as indicated for disinfecting solutions.

## 2020-01-11 NOTE — Patient Instructions (Signed)
Great to meet you! I have entered an order for a nerve conduction study to see if there may be some nerve damage to the L leg.   Electromyoneurogram Electromyoneurogram is a test to check how well your muscles and nerves are working. This procedure includes the combined use of electromyogram (EMG) and nerve conduction study (NCS). EMG is used to look for muscular disorders. NCS, which is also called electroneurogram, measures how well your nerves are controlling your muscles. The procedures are usually done together to check if your muscles and nerves are healthy. If the results of the tests are abnormal, this may indicate disease or injury, such as a neuromuscular disease or peripheral nerve damage. Tell a health care provider about:  Any allergies you have.  All medicines you are taking, including vitamins, herbs, eye drops, creams, and over-the-counter medicines.  Any problems you or family members have had with anesthetic medicines.  Any blood disorders you have.  Any surgeries you have had.  Any medical conditions you have.  If you have a pacemaker.  Whether you are pregnant or may be pregnant. What are the risks? Generally, this is a safe procedure. However, problems may occur, including:  Infection where the electrodes were inserted.  Bleeding. What happens before the procedure? Medicines Ask your health care provider about:  Changing or stopping your regular medicines. This is especially important if you are taking diabetes medicines or blood thinners.  Taking medicines such as aspirin and ibuprofen. These medicines can thin your blood. Do not take these medicines unless your health care provider tells you to take them.  Taking over-the-counter medicines, vitamins, herbs, and supplements. General instructions  Your health care provider may ask you to avoid: ? Beverages that have caffeine, such as coffee and tea. ? Any products that contain nicotine or tobacco. These  products include cigarettes, e-cigarettes, and chewing tobacco. If you need help quitting, ask your health care provider.  Do not use lotions or creams on the same day that you will be having the procedure. What happens during the procedure? For EMG   Your health care provider will ask you to stay in a position so that he or she can access the muscle that will be studied. You may be standing, sitting, or lying down.  You may be given a medicine that numbs the area (local anesthetic).  A very thin needle that has an electrode will be inserted into your muscle.  Another small electrode will be placed on your skin near the muscle.  Your health care provider will ask you to continue to remain still.  The electrodes will send a signal that tells about the electrical activity of your muscles. You may see this on a monitor or hear it in the room.  After your muscles have been studied at rest, your health care provider will ask you to contract or flex your muscles. The electrodes will send a signal that tells about the electrical activity of your muscles.  Your health care provider will remove the electrodes and the electrode needles when the procedure is finished. The procedure may vary among health care providers and hospitals. For NCS   An electrode that records your nerve activity (recording electrode) will be placed on your skin by the muscle that is being studied.  An electrode that is used as a reference (reference electrode) will be placed near the recording electrode.  A paste or gel will be applied to your skin between the recording electrode and  the reference electrode.  Your nerve will be stimulated with a mild shock. Your health care provider will measure how much time it takes for your muscle to react.  Your health care provider will remove the electrodes and the gel when the procedure is finished. The procedure may vary among health care providers and hospitals. What  happens after the procedure?  It is up to you to get the results of your procedure. Ask your health care provider, or the department that is doing the procedure, when your results will be ready.  Your health care provider may: ? Give you medicines for any pain. ? Monitor the insertion sites to make sure that bleeding stops. Summary  Electromyoneurogram is a test to check how well your muscles and nerves are working.  If the results of the tests are abnormal, this may indicate disease or injury.  This is a safe procedure. However, problems may occur, such as bleeding and infection.  Your health care provider will do two tests to complete this procedure. One checks your muscles (EMG) and another checks your nerves (NCS).  It is up to you to get the results of your procedure. Ask your health care provider, or the department that is doing the procedure, when your results will be ready. This information is not intended to replace advice given to you by your health care provider. Make sure you discuss any questions you have with your health care provider. Document Revised: 08/11/2018 Document Reviewed: 07/24/2018 Elsevier Patient Education  [place].

## 2020-01-11 NOTE — Assessment & Plan Note (Signed)
BP is well controlled at this time continue current dose of HCTZ.

## 2020-01-20 ENCOUNTER — Ambulatory Visit (INDEPENDENT_AMBULATORY_CARE_PROVIDER_SITE_OTHER): Payer: Medicare HMO | Admitting: Diagnostic Neuroimaging

## 2020-01-20 ENCOUNTER — Other Ambulatory Visit: Payer: Self-pay

## 2020-01-20 ENCOUNTER — Encounter (INDEPENDENT_AMBULATORY_CARE_PROVIDER_SITE_OTHER): Payer: Medicare HMO | Admitting: Diagnostic Neuroimaging

## 2020-01-20 DIAGNOSIS — R202 Paresthesia of skin: Secondary | ICD-10-CM | POA: Diagnosis not present

## 2020-01-20 DIAGNOSIS — Z0289 Encounter for other administrative examinations: Secondary | ICD-10-CM

## 2020-01-20 DIAGNOSIS — R29898 Other symptoms and signs involving the musculoskeletal system: Secondary | ICD-10-CM

## 2020-01-20 NOTE — Procedures (Signed)
[facility]  NCS (NERVE CONDUCTION STUDY) WITH EMG (ELECTROMYOGRAPHY) REPORT   STUDY DATE: 01/20/20 PATIENT NAME: Angel Krueger DOB: 03/25/1946 MRN: [id number]  ORDERING CLINICIAN: Luetta Nutting, DO   TECHNOLOGIST: Sherre Scarlet ELECTROMYOGRAPHER: Earlean Polka. Vesta Wheeland, MD  CLINICAL INFORMATION: 74 year old male with post-COVID muscle weakness and numbness in legs starting 2 weeks after onset of infection and antibiotic therapy in December 2020.  Symptoms improving over the last 1 month.  FINDINGS: NERVE CONDUCTION STUDY:  Bilateral peroneal motor responses of normal distal latencies, decreased amplitudes, slow conduction velocities.  Bilateral tibial motor responses are normal.  Right sural sensory response is normal.  Left sural sensory response has prolonged peak latency and normal amplitude.  Bilateral superficial peroneal sensory responses of normal peak latencies and decreased amplitudes.   NEEDLE ELECTROMYOGRAPHY:  Needle examination of bilateral lower extremities notable for positive sharp waves and fibrillation potentials in left tibialis anterior and left gastrocnemius muscles at rest.  Decreased motor unit recruitment noted in left tibialis anterior with a small sized motor units.  Motor unit recruitment and left gastrocnemius, left vastus medialis and right tibialis anterior is normal.   IMPRESSION:   Abnormal study demonstrating: -Axonal sensorimotor polyneuropathy (mainly affecting the left leg, and slightly affecting the right leg). -Active and chronic denervation noted in left tibialis anterior muscle; chronic denervation noted in left gastrocnemius.  -Given the clinical context this may represent a post viral neuropathy (acute motor-sensory axonal neuropathy; AMSAN); patient reports gradual improvement over the past 1 month. Recommend to monitor, consider PT evaluation and AFO brace.    INTERPRETING PHYSICIAN:  Penni Bombard,  MD Certified in Neurology, Neurophysiology and Neuroimaging  [facility] [address] [place], [place] [address] [phone]   [facility]    Nerve / Sites Muscle Latency Ref. Amplitude Ref. Rel Amp Segments Distance Velocity Ref. Area    ms ms mV mV %  cm m/s m/s mVms  R Peroneal - EDB     Ankle EDB 6.1 ?6.5 0.4 ?2.0 100 Ankle - EDB 9   2.4     Fib head EDB 15.1  0.4  89.5 Fib head - Ankle 30 33 ?44 2.8     Pop fossa EDB 17.6  0.3  90.7 Pop fossa - Fib head 10 40 ?44 2.4         Pop fossa - Ankle      L Peroneal - EDB     Ankle EDB 6.2 ?6.5 1.9 ?2.0 100 Ankle - EDB 9   10.4     Fib head EDB 14.5  1.1  60.2 Fib head - Ankle 29 35 ?44 6.3     Pop fossa EDB 17.5  0.5  44.9 Pop fossa - Fib head 10 34 ?44 4.0         Pop fossa - Ankle      R Tibial - AH     Ankle AH 5.2 ?5.8 7.4 ?4.0 100 Ankle - AH 9   19.7     Pop fossa AH 14.8  5.3  71.2 Pop fossa - Ankle 40 42 ?41 20.6  L Tibial - AH     Ankle AH 5.8 ?5.8 6.0 ?4.0 100 Ankle - AH 9   15.5     Pop fossa AH 15.6  4.0  66 Pop fossa - Ankle 40 41 ?41 13.5             SNC    Nerve / Sites  Rec. Site Peak Lat Ref.  Amp Ref. Segments Distance    ms ms V V  cm  R Sural - Ankle (Calf)     Calf Ankle 4.2 ?4.4 8 ?6 Calf - Ankle 14  L Sural - Ankle (Calf)     Calf Ankle 4.9 ?4.4 6 ?6 Calf - Ankle 14  R Superficial peroneal - Ankle     Lat leg Ankle 4.2 ?4.4 4 ?6 Lat leg - Ankle 14  L Superficial peroneal - Ankle     Lat leg Ankle 4.3 ?4.4 5 ?6 Lat leg - Ankle 14             F  Wave    Nerve F Lat Ref.   ms ms  R Tibial - AH 53.5 ?56.0  L Tibial - AH 60.5 ?56.0         EMG Summary Table    Spontaneous MUAP Recruitment  Muscle IA Fib PSW Fasc Other Amp Dur. Poly Pattern  L. Vastus medialis Normal None None None _______ Normal Normal Normal Normal  L. Tibialis anterior Normal 2+ 2+ None _______ Decreased Normal Normal Reduced  L. Gastrocnemius (Medial head) Normal 1+ 1+ None _______ Normal Normal Normal  Normal  R. Tibialis anterior Normal None None None _______ Normal Normal Normal Normal

## 2020-01-21 ENCOUNTER — Other Ambulatory Visit: Payer: Self-pay | Admitting: Family Medicine

## 2020-01-21 DIAGNOSIS — R29898 Other symptoms and signs involving the musculoskeletal system: Secondary | ICD-10-CM

## 2020-01-31 ENCOUNTER — Other Ambulatory Visit: Payer: Self-pay

## 2020-01-31 ENCOUNTER — Ambulatory Visit: Payer: Medicare HMO | Admitting: Rehabilitative and Restorative Service Providers"

## 2020-01-31 ENCOUNTER — Encounter: Payer: Self-pay | Admitting: Rehabilitative and Restorative Service Providers"

## 2020-01-31 DIAGNOSIS — M6281 Muscle weakness (generalized): Secondary | ICD-10-CM

## 2020-01-31 DIAGNOSIS — R2689 Other abnormalities of gait and mobility: Secondary | ICD-10-CM | POA: Diagnosis not present

## 2020-01-31 NOTE — Patient Instructions (Signed)
Access Code: [email or web address]  URL: [email or web address]  Date: 01/31/2020  Prepared by: Corlis Leak   Exercises  Supine Hamstring Stretch with Strap - 3 reps - 1 sets - 30 seconds hold - 2x daily - 7x weekly  Supine Bridge - 10 reps - 1 sets - 10 sec hold - 2x daily - 7x weekly  Supine Ankle Pumps - 3 reps - 1 sets - 2-3 sec hold - 2x daily - 7x weekly  Standing Heel Raise with Support - 5 reps - 1 sets - 10 sec hold - 2x daily - 7x weekly  Mini Squat with Counter Support - 10 reps - 1 sets - 2x daily - 7x weekly  Standing Single Leg Stance with Unilateral Counter Support - 3 reps - 1 sets - 10sec hold - 2x daily - 7x weekly

## 2020-01-31 NOTE — Therapy (Addendum)
[facility] [place], [place], [address] Phone: [phone]   Fax:  [phone]  Physical Therapy Evaluation  Patient Details  Name: Angel Krueger MRN: [id number] Date of Birth: January 24, 1946 Referring Provider (PT): Dr. Luetta Nutting    Encounter Date: 01/31/2020  PT End of Session - 01/31/20 1608    Visit Number  1    Number of Visits  12    Date for PT Re-Evaluation  03/13/20    PT Start Time  0802    PT Stop Time  0845    PT Time Calculation (min)  43 min    Activity Tolerance  Patient tolerated treatment well       Past Medical History:  Diagnosis Date  . Generalized headaches     Past Surgical History:  Procedure Laterality Date  . COLON SURGERY     Unknown specifics    There were no vitals filed for this visit.   Subjective Assessment - 01/31/20 0803    Subjective  Patient reports weakness in his Lt leg for the past couple of months following COVID infection. He had weakness in both legs - Lt > Rt. Weakness in improving but continues to be a problem in the Lt LE    Pertinent History  HTN; COVID 12/20    Patient Stated Goals  Get leg stronger; be able lift foot toward nose         [facility] PT Assessment - 01/31/20 0001      Assessment   Medical Diagnosis  Lt LE weakness post Covid     Referring Provider (PT)  Dr. Luetta Nutting     Onset Date/Surgical Date  11/18/19    Hand Dominance  Right    Next MD Visit  PRN    Prior Therapy  none       Precautions   Precautions  None      Balance Screen   Has the patient fallen in the past 6 months  No    Has the patient had a decrease in activity level because of a fear of falling?   No    Is the patient reluctant to leave their home because of a fear of falling?   No      Home Environment   Living Environment  Private residence    Living Arrangements  Spouse/significant other    Type of 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     Leisure  gardening and yard work; exercise center daily before [place]       Observation/Other Assessments   Focus on Therapeutic Outcomes (FOTO)   43% limitation       Observation/Other Assessments-Edema    Edema  --   mild edema dorsum Lt foot      Sensation   Additional Comments  WFL's per pt report       Functional Tests   Functional tests  Sit to Stand      Sit to Stand   Comments  moves sit to stand with weight shifted to Rt LE; use of UE's on arms of chair       Posture/Postural Control   Posture Comments  weight shifted to Rt in standing; fsome  forward flexion at trunk and hips       AROM   Right/Left Hip  --   WFL's bilat    Right/Left Knee  --   WFL's bilat    Right Ankle Dorsiflexion  7    Left Ankle Dorsiflexion  -19      PROM   Overall PROM Comments  WFL's - some stiffness Lt ankle compared to Rt       Strength   Right Hip Flexion  5/5    Right Hip Extension  5/5    Right Hip ABduction  4+/5    Left Hip Flexion  4+/5    Left Hip Extension  4+/5    Left Hip ABduction  4/5    Right Knee Flexion  5/5    Right Knee Extension  5/5    Left Knee Flexion  4/5    Left Knee Extension  4+/5    Right Ankle Dorsiflexion  5/5    Left Ankle Dorsiflexion  2-/5      Flexibility   Hamstrings  tight Rt ~ 45 deg; Lt 40 deg       Ambulation/Gait   Gait Comments  ambulates with limp Lt LE; steppage gait Lt foot with decreased ankle DF in swing phase       Balance   Balance Assessed  --   SLS Rt 5 sec; Lt 2 sec               Objective measurements completed on examination: See above findings.      [facility] PT Treatment/Exercise - 01/31/20 0001      Ankle Exercises: Stretches   Other Stretch  hamstring stretch supine with strap 30 sec x 2 each LE       Ankle Exercises: Standing    SLS  each LE with UE support as needed for balance ~ 5-10 sec x 3 trials each LE     Heel Raises  Both;10 reps;1 second;2 seconds    Other Standing Ankle Exercises  shallow knee bend - UE support as needed x 10       Ankle Exercises: Seated   Toe Raise  10 reps;1 second;2 seconds   Lt      Ankle Exercises: Supine   Other Supine Ankle Exercises  bridging x 10 - 5 sec hold              PT Education - 01/31/20 1607    Education Details  HEP: POC; nature of weakness and response to treatment/need for HEP    Person(s) Educated  Patient    Methods  Explanation;Demonstration;Tactile cues;Verbal cues;Handout    Comprehension  Verbalized understanding;Returned demonstration;Verbal cues required;Tactile cues required          PT Long Term Goals - 01/31/20 1614      PT LONG TERM GOAL #1   Title  Increase active Lt ankle DF to =/> than Rt ankle DF    Time  6    Period  Weeks    Status  New    Target Date  03/13/20      PT LONG TERM GOAL #2   Title  Increase strength Lt LE to at least 4/5 to 5/5 throughout    Time  6    Period  Weeks    Status  New    Target Date  03/14/20      PT LONG TERM GOAL #3   Title  Improve gait with patient to demonstrate  more normal gait pattern with good control of Lt ankle DF in swing phase of gait thus improving quality and safety of gait    Time  6    Period  Weeks    Status  New    Target Date  03/13/20      PT LONG TERM GOAL #4   Title  Independent in HEP - with return to community based exercise as conditions allow    Time  6    Period  Weeks    Status  New    Target Date  03/13/20      PT LONG TERM GOAL #5   Title  Improve FOTO to </= 26% limitation    Time  6    Period  Weeks    Status  New    Target Date  03/13/20             Plan - 01/31/20 1751    Clinical Impression Statement  Patient presents with isolated Lt LE weakness primarily in ankle with some decreased strength noted in the knee and hip as well. He has  decreased active ROM; decreased strength; abnormal gait pattern; decreased balance increasing risk of falling. Symptoms occured post COVID virus 11/18/19. Patient reports initially experiencing bilat LE weakness but Rt LE weakness has resolved for the most part. Patient will benefit from PT to address problems identified and return to prior level of independent function.    Personal Factors and Comorbidities  Comorbidity 1    Comorbidities  HTN    Examination-Activity Limitations  Locomotion Level    Examination-Participation Restrictions  Community Activity    Stability/Clinical Decision Making  Stable/Uncomplicated    Clinical Decision Making  Low    Rehab Potential  Good    PT Frequency  2x / week    PT Duration  6 weeks    PT Treatment/Interventions  ADLs/Self Care Home Management;Cryotherapy;Electrical Stimulation;Moist Heat;DME Instruction;Gait training;Therapeutic activities;Therapeutic exercise;Balance training;Neuromuscular re-education;Patient/family education;Orthotic Fit/Training;Manual techniques;Passive range of motion;Dry needling;Taping    PT Next Visit Plan  review and progress with HEP; work on balance and gait; consider taping Lt ankle to facilitate ankle DF    PT Home Exercise Plan  [id number]    Consulted and Agree with Plan of Care  Patient       Patient will benefit from skilled therapeutic intervention in order to improve the following deficits and impairments:  Abnormal gait, Decreased coordination, Decreased range of motion, Decreased activity tolerance, Decreased balance, Decreased strength  Visit Diagnosis: Muscle weakness (generalized) - Plan: PT plan of care cert/re-cert  Other abnormalities of gait and mobility - Plan: PT plan of care cert/re-cert     Problem List Patient Active Problem List   Diagnosis Date Noted  . Weakness of left leg 01/11/2020  . Polyarthralgia 11/18/2019  . Generalized weakness 11/18/2019  . Generalized muscle ache 11/18/2019  .  Close exposure to COVID-19 virus 11/18/2019  . Lumbar spinal stenosis 10/19/2019  . Coughing 10/02/2018  . Dizziness 08/23/2018  . Noncompliance with medication regimen 08/23/2018  . Transient ischemic attack (TIA) 08/23/2018  . Hyperglycemia 08/12/2018  . Constipation 09/09/2016  . Skin lesion of scalp 02/29/2016  . Pseudophakia of both eyes 03/14/2015  . Chronic angle-closure glaucoma 03/13/2015  . Miosis 03/13/2015  . Hyperplastic colon polyp 04/20/2013  . HTN (hypertension) 03/15/2013  . Hyperlipidemia with target LDL less than 130 02/16/2013  . History of migraine 02/15/2013    Zakariah Urwin Nilda Simmer  PT, MPH  01/31/2020,  4:20 PM  [facility] [place], [place], [address] Phone: [phone]   Fax:  [phone]  Name: Angel Krueger MRN: [id number] Date of Birth: 05/25/1946  PHYSICAL THERAPY DISCHARGE SUMMARY  Visits from Start of Care: Evaluation only   Current functional level related to goals / functional outcomes: Patient seen for evaluation only    Remaining deficits: Unknown    Education / Equipment: Initial HEP Plan: Patient agrees to discharge.  Patient goals were not met. Patient is being discharged due to not returning since the last visit.  ?????    Gladine Plude P. Helene Kelp PT, MPH 04/06/20 5:03 PM

## 2020-02-03 ENCOUNTER — Encounter: Payer: Medicare HMO | Admitting: Physical Therapy

## 2020-02-07 ENCOUNTER — Encounter: Payer: Medicare HMO | Admitting: Rehabilitative and Restorative Service Providers"

## 2020-02-10 ENCOUNTER — Encounter: Payer: Medicare HMO | Admitting: Physical Therapy

## 2020-02-14 ENCOUNTER — Encounter: Payer: Medicare HMO | Admitting: Rehabilitative and Restorative Service Providers"

## 2020-02-17 ENCOUNTER — Encounter: Payer: Medicare HMO | Admitting: Physical Therapy

## 2020-03-05 ENCOUNTER — Encounter: Payer: Self-pay | Admitting: Family Medicine

## 2020-05-05 ENCOUNTER — Telehealth: Payer: Self-pay | Admitting: Family Medicine

## 2020-05-05 NOTE — Telephone Encounter (Signed)
Appointment has been made. No further questions at this time.  

## 2020-05-05 NOTE — Telephone Encounter (Signed)
Ok with me 

## 2020-05-05 NOTE — Telephone Encounter (Signed)
I am fine if Dr. Ashley Royalty is ok

## 2020-05-05 NOTE — Telephone Encounter (Signed)
Wife calling in wanting to know if husband can switch PCP from Dr.Matthews to Dr.Metheney. Wife is a patient of Dr.Metheney. Please advise if this is okay to switch.

## 2020-05-22 ENCOUNTER — Encounter: Payer: Self-pay | Admitting: Family Medicine

## 2020-05-22 ENCOUNTER — Ambulatory Visit (INDEPENDENT_AMBULATORY_CARE_PROVIDER_SITE_OTHER): Payer: Medicare HMO | Admitting: Family Medicine

## 2020-05-22 ENCOUNTER — Other Ambulatory Visit: Payer: Self-pay

## 2020-05-22 VITALS — BP 155/82 | HR 61 | Ht 67.0 in | Wt 159.0 lb

## 2020-05-22 DIAGNOSIS — H402290 Chronic angle-closure glaucoma, unspecified eye, stage unspecified: Secondary | ICD-10-CM | POA: Diagnosis not present

## 2020-05-22 DIAGNOSIS — R7309 Other abnormal glucose: Secondary | ICD-10-CM | POA: Diagnosis not present

## 2020-05-22 DIAGNOSIS — I10 Essential (primary) hypertension: Secondary | ICD-10-CM

## 2020-05-22 DIAGNOSIS — M7989 Other specified soft tissue disorders: Secondary | ICD-10-CM | POA: Diagnosis not present

## 2020-05-22 DIAGNOSIS — Z125 Encounter for screening for malignant neoplasm of prostate: Secondary | ICD-10-CM

## 2020-05-22 MED ORDER — AMBULATORY NON FORMULARY MEDICATION: 0 refills | Status: DC

## 2020-05-22 NOTE — Assessment & Plan Note (Signed)
Blood pressures have been elevated the last several times that he has been here I gave him some parameters for checking his blood pressure at home and to let me know if he is seeing blood pressures greater than 140.  We did discuss possibly starting an ACE inhibitor or hydrochlorothiazide to help control his blood sugars.

## 2020-05-22 NOTE — Progress Notes (Signed)
Established Patient Office Visit  Subjective:  Patient ID: Angel Krueger, male    DOB: July 09, 1946  Age: 74 y.o. MRN: [id number]  CC:  Chief Complaint  Patient presents with  . Establish Care    HPI Angel Krueger presents  Follow-up.  He is actually doing really well overall.  He does have a diagnosis of glaucoma for at least the last 15 to 20 years and has been on eyedrops since then.  He does have a family history in his mother who also has glaucoma.  He has been checking his blood sugars at home and they have been consistently under 120 fasting mostly in the teens.  Also went to discuss some swelling mostly on the top of his left foot.  He says he had noticed it before having had Covid once in a while such as flying etc.  But while he had Covid back in December it was significantly swollen.  Since then he will still get some swelling on the top of the foot only particularly with sitting for long periods of time.  He denies any pain or tenderness in that area.  Denies any other joint swelling in the body such as in the hands or knees.    Past Medical History:  Diagnosis Date  . Generalized headaches     Past Surgical History:  Procedure Laterality Date  . COLON SURGERY     Unknown specifics    Family History  Problem Relation Age of Onset  . Glaucoma Mother     Social History   Socioeconomic History  . Marital status: Married    Spouse name: remilila  . Number of children: 2  . Years of education: 62  . Highest education level: Some college, no degree  Occupational History  . Occupation: Statistician    Comment: retired  Tobacco Use  . Smoking status: Never Smoker  . Smokeless tobacco: Never Used  Vaping Use  . Vaping Use: Never used  Substance and Sexual Activity  . Alcohol use: Not Currently  . Drug use: No  . Sexual activity: Yes  Other Topics Concern  . Not on file  Social History Narrative   Work In the garden   Clean yard. Walks now since gym isn't  open   Social Determinants of Health   Financial Resource Strain: Low Risk   . Difficulty of Paying Living Expenses: Not hard at all  Food Insecurity: No Food Insecurity  . Worried About Programme researcher, broadcasting/film/video in the Last Year: Never true  . Ran Out of Food in the Last Year: Never true  Transportation Needs: No Transportation Needs  . Lack of Transportation (Medical): No  . Lack of Transportation (Non-Medical): No  Physical Activity: Sufficiently Active  . Days of Exercise per Week: 4 days  . Minutes of Exercise per Session: 40 min  Stress: No Stress Concern Present  . Feeling of Stress : Not at all  Social Connections: Moderately Integrated  . Frequency of Communication with Friends and Family: More than three times a week  . Frequency of Social Gatherings with Friends and Family: Three times a week  . Attends Religious Services: More than 4 times per year  . Active Member of Clubs or Organizations: No  . Attends Banker Meetings: Never  . Marital Status: Married  Catering manager Violence: Not At Risk  . Fear of Current or Ex-Partner: No  . Emotionally Abused: No  . Physically Abused: No  . Sexually Abused: No  Outpatient Medications Prior to Visit  Medication Sig Dispense Refill  . dorzolamide (TRUSOPT) 2 % ophthalmic solution 1 drop 3 (three) times daily.    . dorzolamide-timolol (COSOPT) 22.3-6.8 MG/ML ophthalmic solution Apply to eye.    . latanoprost (XALATAN) 0.005 % ophthalmic solution     . benzonatate (TESSALON) 100 MG capsule Take 1 capsule (100 mg total) by mouth 2 (two) times daily as needed for cough. 20 capsule 0  . BRIMONIDINE TARTRATE OP Apply to eye.    Marland Kitchen dexamethasone (DECADRON) 4 MG tablet Take 1 tablet (4 mg total) by mouth 3 (three) times daily. 15 tablet 0  . hydrochlorothiazide (HYDRODIURIL) 12.5 MG tablet TAKE 1 TABLET EVERY DAY. APPOINTMENT NEEDED FOR FURTHER REFILLS 90 tablet 0  . HYDROcodone-acetaminophen (NORCO/VICODIN) 5-325 MG tablet  Take 1 tablet by mouth every 8 (eight) hours as needed for moderate pain. 15 tablet 0  . meloxicam (MOBIC) 15 MG tablet One tab PO qAM with breakfast for 2 weeks, then daily prn pain. 30 tablet 3  . polyethylene glycol (MIRALAX / GLYCOLAX) 17 g packet Take 17 g by mouth 2 (two) times daily. Until stooling regularly 30 packet 11  . LATANOPROST OP Apply to eye.     No facility-administered medications prior to visit.    Allergies  Allergen Reactions  . Penicillins     ROS Review of Systems    Objective:    Physical Exam Constitutional:      Appearance: He is well-developed.  HENT:     Head: Normocephalic and atraumatic.  Neck:     Comments: No carotid bruits.  Cardiovascular:     Rate and Rhythm: Normal rate and regular rhythm.     Heart sounds: Normal heart sounds.  Pulmonary:     Effort: Pulmonary effort is normal.     Breath sounds: Normal breath sounds.  Musculoskeletal:     Comments: Left foot with no abnormality rash discoloration or swelling today.  Ankle with normal range of motion.  Dorsal pedal pulse 2+ and posterior tibial pulse 2+.  Good capillary refill.  Nontender along the metatarsals.  Skin:    General: Skin is warm and dry.  Neurological:     Mental Status: He is alert and oriented to person, place, and time.  Psychiatric:        Behavior: Behavior normal.     BP (!) 155/82   Pulse 61   Ht 5\' 7"  (1.702 m)   Wt 159 lb (72.1 kg)   SpO2 99%   BMI 24.90 kg/m  Wt Readings from Last 3 Encounters:  05/22/20 159 lb (72.1 kg)  01/11/20 156 lb 14.4 oz (71.2 kg)  11/18/19 158 lb (71.7 kg)     There are no preventive care reminders to display for this patient.  There are no preventive care reminders to display for this patient.  Lab Results  Component Value Date   TSH 2.40 10/08/2019   Lab Results  Component Value Date   WBC 5.5 05/22/2020   HGB 16.3 05/22/2020   HCT 47.6 05/22/2020   MCV 85.3 05/22/2020   PLT 171 05/22/2020   Lab Results   Component Value Date   NA 137 10/08/2019   K 4.9 10/08/2019   CO2 30 10/08/2019   GLUCOSE 127 (H) 10/08/2019   BUN 13 10/08/2019   CREATININE 1.00 10/08/2019   BILITOT 0.5 10/08/2019   ALKPHOS 50 10/07/2016   AST 24 10/08/2019   ALT 18 10/08/2019   PROT 7.5 10/08/2019  ALBUMIN 4.2 10/07/2016   CALCIUM 9.4 10/08/2019   Lab Results  Component Value Date   CHOL 193 06/10/2019   Lab Results  Component Value Date   HDL 44 06/10/2019   Lab Results  Component Value Date   LDLCALC 121 (H) 06/10/2019   Lab Results  Component Value Date   TRIG 164 (H) 06/10/2019   Lab Results  Component Value Date   CHOLHDL 4.4 06/10/2019   No results found for: HGBA1C    Assessment & Plan:   Problem List Items Addressed This Visit      Cardiovascular and Mediastinum   HTN (hypertension) - Primary    Blood pressures have been elevated the last several times that he has been here I gave him some parameters for checking his blood pressure at home and to let me know if he is seeing blood pressures greater than 140.  We did discuss possibly starting an ACE inhibitor or hydrochlorothiazide to help control his blood sugars.      Relevant Orders   COMPLETE METABOLIC PANEL WITH GFR   Lipid panel   CBC (Completed)   Hemoglobin A1c   PSA     Other   Chronic angle-closure glaucoma    Follows with his eye doctor regularly he is been on drops for years.      Relevant Medications   dorzolamide-timolol (COSOPT) 22.3-6.8 MG/ML ophthalmic solution   latanoprost (XALATAN) 0.005 % ophthalmic solution    Other Visit Diagnoses    Screening for prostate cancer       Relevant Orders   PSA   Abnormal glucose       Relevant Orders   Hemoglobin A1c   Foot swelling          Foot swelling on the top of the left foot-unclear etiology it certainly happens when he has been sitting for long period of time consistent with dependent edema but it never happens around the ankle just on top of the foot.   He denies any pain or old injury etc.  Right now it seems like it is fairly controllable with elevating the foot but if any point he develops pain or worsening symptoms then we can always get x-rays for further work-up.  It does not sound like it is painful like gout so this is much less likely.  With his elevated blood pressure and cholesterol he really needs to be screened for diabetes.  Hemoglobin A1c ordered today.  Did discuss the need for shingles vaccine.  We will get up-to-date blood work.  Meds ordered this encounter  Medications  . AMBULATORY NON FORMULARY MEDICATION    Sig: Medication Name: Shingrix IM x 1. Repeat dose in 2-6 months.    Dispense:  1 vial    Refill:  0    Follow-up: Return in about 4 weeks (around 06/19/2020) for Nurse visit to recheck BP.  Bring in home log of BPs.  .   Time spent 32 minutes in encounter and including reviewing notes as he is new patient to me even though he is not new to our office.  Beatrice Lecher, MD

## 2020-05-22 NOTE — Patient Instructions (Addendum)
Blood pressure goal is under 140 on the top and under 90 on the bottom number.  Try to check it a couple of times a week.  If you are getting numbers greater than 140/90 then please let me know and we would need to consider starting you on a blood pressure pill to help control this.  This would reduce your risk for future heart attack and stroke and kidney failure.  You are due for the new shingles vaccine. See printed scription.  You will have to get this administered at your pharmacy.  It is covered under your insurance under your Medicare part D.

## 2020-05-22 NOTE — Assessment & Plan Note (Signed)
Follows with his eye doctor regularly he is been on drops for years.

## 2020-05-22 NOTE — Progress Notes (Signed)
  Pt reports that he sometimes gets swelling at the top of his foot

## 2020-05-23 LAB — LIPID PANEL
Cholesterol: 183 mg/dL (ref <200)
HDL: 46 mg/dL (ref >=40)
LDL Cholesterol (Calc): 119 mg/dL (calc) — ABNORMAL HIGH
Non-HDL Cholesterol (Calc): 137 mg/dL (calc) — ABNORMAL HIGH (ref <130)
Total CHOL/HDL Ratio: 4 (calc) (ref <5.0)
Triglycerides: 81 mg/dL (ref <150)

## 2020-05-23 LAB — CBC
HCT: 47.6 % (ref 38.5–50.0)
Hemoglobin: 16.3 g/dL (ref 13.2–17.1)
MCH: 29.2 pg (ref 27.0–33.0)
MCHC: 34.2 g/dL (ref 32.0–36.0)
MCV: 85.3 fL (ref 80.0–100.0)
MPV: 11.6 fL (ref 7.5–12.5)
Platelets: 171 10*3/uL (ref 140–400)
RBC: 5.58 10*6/uL (ref 4.20–5.80)
RDW: 12.8 % (ref 11.0–15.0)
WBC: 5.5 10*3/uL (ref 3.8–10.8)

## 2020-05-23 LAB — COMPLETE METABOLIC PANEL WITH GFR
AG Ratio: 1.4 (calc) (ref 1.0–2.5)
ALT: 15 U/L (ref 9–46)
AST: 21 U/L (ref 10–35)
Albumin: 4.1 g/dL (ref 3.6–5.1)
Alkaline phosphatase (APISO): 58 U/L (ref 35–144)
BUN: 10 mg/dL (ref 7–25)
CO2: 30 mmol/L (ref 20–32)
Calcium: 9.2 mg/dL (ref 8.6–10.3)
Chloride: 105 mmol/L (ref 98–110)
Creat: 0.95 mg/dL (ref 0.70–1.18)
GFR, Est African American: 92 mL/min/{1.73_m2} (ref >=60)
GFR, Est Non African American: 80 mL/min/{1.73_m2} (ref >=60)
Globulin: 2.9 g/dL (calc) (ref 1.9–3.7)
Glucose, Bld: 116 mg/dL — ABNORMAL HIGH (ref 65–99)
Potassium: 4.8 mmol/L (ref 3.5–5.3)
Sodium: 139 mmol/L (ref 135–146)
Total Bilirubin: 0.8 mg/dL (ref 0.2–1.2)
Total Protein: 7 g/dL (ref 6.1–8.1)

## 2020-05-23 LAB — HEMOGLOBIN A1C
Hgb A1c MFr Bld: 6.5 % of total Hgb — ABNORMAL HIGH (ref <5.7)
Mean Plasma Glucose: 140 (calc)
eAG (mmol/L): 7.7 (calc)

## 2020-05-23 LAB — PSA: PSA: 0.8 ng/mL (ref <=4.0)

## 2020-06-20 DIAGNOSIS — H4051X3 Glaucoma secondary to other eye disorders, right eye, severe stage: Secondary | ICD-10-CM | POA: Diagnosis not present

## 2020-06-20 DIAGNOSIS — H4052X2 Glaucoma secondary to other eye disorders, left eye, moderate stage: Secondary | ICD-10-CM | POA: Diagnosis not present

## 2020-06-21 ENCOUNTER — Other Ambulatory Visit: Payer: Self-pay

## 2020-06-21 ENCOUNTER — Ambulatory Visit (INDEPENDENT_AMBULATORY_CARE_PROVIDER_SITE_OTHER): Payer: Medicare HMO | Admitting: Family Medicine

## 2020-06-21 VITALS — BP 110/66 | HR 60 | Wt 158.0 lb

## 2020-06-21 DIAGNOSIS — I10 Essential (primary) hypertension: Secondary | ICD-10-CM | POA: Diagnosis not present

## 2020-06-21 NOTE — Progress Notes (Signed)
Established Patient Office Visit  Subjective:  Patient ID: Angel Krueger, male    DOB: 1946-03-31  Age: 74 y.o. MRN: [id number]  CC:  Chief Complaint  Patient presents with  . Hypertension    HPI Daily Crate presents for blood pressure check. He did not bring home blood pressure monitor. Denies chest pain, shortness of breath or dizziness. He has been taking propranolol. We were unaware of this and didn't have it on his medication list.    Past Medical History:  Diagnosis Date  . Generalized headaches     Past Surgical History:  Procedure Laterality Date  . COLON SURGERY     Unknown specifics    Family History  Problem Relation Age of Onset  . Glaucoma Mother     Social History   Socioeconomic History  . Marital status: Married    Spouse name: remilila  . Number of children: 2  . Years of education: 36  . Highest education level: Some college, no degree  Occupational History  . Occupation: Statistician    Comment: retired  Tobacco Use  . Smoking status: Never Smoker  . Smokeless tobacco: Never Used  Vaping Use  . Vaping Use: Never used  Substance and Sexual Activity  . Alcohol use: Not Currently  . Drug use: No  . Sexual activity: Yes  Other Topics Concern  . Not on file  Social History Narrative   Work In the garden   Clean yard. Walks now since gym isn't open   Social Determinants of Health   Financial Resource Strain:   . Difficulty of Paying Living Expenses:   Food Insecurity:   . Worried About Programme researcher, broadcasting/film/video in the Last Year:   . Barista in the Last Year:   Transportation Needs:   . Freight forwarder (Medical):   Marland Kitchen Lack of Transportation (Non-Medical):   Physical Activity:   . Days of Exercise per Week:   . Minutes of Exercise per Session:   Stress:   . Feeling of Stress :   Social Connections:   . Frequency of Communication with Friends and Family:   . Frequency of Social Gatherings with Friends and Family:   .  Attends Religious Services:   . Active Member of Clubs or Organizations:   . Attends Banker Meetings:   Marland Kitchen Marital Status:   Intimate Partner Violence:   . Fear of Current or Ex-Partner:   . Emotionally Abused:   Marland Kitchen Physically Abused:   . Sexually Abused:     Outpatient Medications Prior to Visit  Medication Sig Dispense Refill  . AMBULATORY NON FORMULARY MEDICATION Medication Name: Shingrix IM x 1. Repeat dose in 2-6 months. 1 vial 0  . dorzolamide (TRUSOPT) 2 % ophthalmic solution 1 drop 3 (three) times daily.    . dorzolamide-timolol (COSOPT) 22.3-6.8 MG/ML ophthalmic solution Apply to eye.    . latanoprost (XALATAN) 0.005 % ophthalmic solution     . propranolol (INDERAL) 80 MG tablet Take 40 mg by mouth daily.     No facility-administered medications prior to visit.    Allergies  Allergen Reactions  . Penicillins     ROS Review of Systems    Objective:    Physical Exam Constitutional:      Appearance: He is well-developed.  HENT:     Head: Normocephalic and atraumatic.  Cardiovascular:     Rate and Rhythm: Normal rate and regular rhythm.     Heart sounds: Normal  heart sounds.  Pulmonary:     Effort: Pulmonary effort is normal.     Breath sounds: Normal breath sounds.  Skin:    General: Skin is warm and dry.  Neurological:     Mental Status: He is alert and oriented to person, place, and time.  Psychiatric:        Behavior: Behavior normal.     BP 110/66   Pulse 60   Wt 158 lb (71.7 kg)   SpO2 100%   BMI 24.75 kg/m  Wt Readings from Last 3 Encounters:  06/21/20 158 lb (71.7 kg)  05/22/20 159 lb (72.1 kg)  01/11/20 156 lb 14.4 oz (71.2 kg)     There are no preventive care reminders to display for this patient.  There are no preventive care reminders to display for this patient.  Lab Results  Component Value Date   TSH 2.40 10/08/2019   Lab Results  Component Value Date   WBC 5.5 05/22/2020   HGB 16.3 05/22/2020   HCT 47.6  05/22/2020   MCV 85.3 05/22/2020   PLT 171 05/22/2020   Lab Results  Component Value Date   NA 139 05/22/2020   K 4.8 05/22/2020   CO2 30 05/22/2020   GLUCOSE 116 (H) 05/22/2020   BUN 10 05/22/2020   CREATININE 0.95 05/22/2020   BILITOT 0.8 05/22/2020   ALKPHOS 50 10/07/2016   AST 21 05/22/2020   ALT 15 05/22/2020   PROT 7.0 05/22/2020   ALBUMIN 4.2 10/07/2016   CALCIUM 9.2 05/22/2020   Lab Results  Component Value Date   CHOL 183 05/22/2020   Lab Results  Component Value Date   HDL 46 05/22/2020   Lab Results  Component Value Date   LDLCALC 119 (H) 05/22/2020   Lab Results  Component Value Date   TRIG 81 05/22/2020   Lab Results  Component Value Date   CHOLHDL 4.0 05/22/2020   Lab Results  Component Value Date   HGBA1C 6.5 (H) 05/22/2020      Assessment & Plan:  HTN - Recheck of blood pressure was within normal limits. Per Dr Linford Arnold, patient advised to continue taking the Propranolol 80 mg half tablet daily. Follow up as needed or 6 months.    Problem List Items Addressed This Visit      Cardiovascular and Mediastinum   HTN (hypertension) - Primary   Relevant Medications   propranolol (INDERAL) 80 MG tablet      No orders of the defined types were placed in this encounter.   Follow-up: No follow-ups on file.    Nani Gasser, MD

## 2020-06-21 NOTE — Progress Notes (Signed)
Agree with documentation as above.   Blessin Kanno, MD  

## 2020-10-28 DIAGNOSIS — Z20822 Contact with and (suspected) exposure to covid-19: Secondary | ICD-10-CM | POA: Diagnosis not present

## 2021-04-04 ENCOUNTER — Encounter: Payer: Medicare HMO | Admitting: Family Medicine

## 2021-04-24 ENCOUNTER — Other Ambulatory Visit: Payer: Self-pay

## 2021-04-24 ENCOUNTER — Encounter: Payer: Self-pay | Admitting: Family Medicine

## 2021-04-24 ENCOUNTER — Ambulatory Visit (INDEPENDENT_AMBULATORY_CARE_PROVIDER_SITE_OTHER): Payer: Medicare HMO | Admitting: Family Medicine

## 2021-04-24 VITALS — BP 134/74 | HR 58 | Ht 67.0 in | Wt 158.0 lb

## 2021-04-24 DIAGNOSIS — D696 Thrombocytopenia, unspecified: Secondary | ICD-10-CM

## 2021-04-24 DIAGNOSIS — Z125 Encounter for screening for malignant neoplasm of prostate: Secondary | ICD-10-CM

## 2021-04-24 DIAGNOSIS — R7301 Impaired fasting glucose: Secondary | ICD-10-CM | POA: Diagnosis not present

## 2021-04-24 DIAGNOSIS — L309 Dermatitis, unspecified: Secondary | ICD-10-CM

## 2021-04-24 DIAGNOSIS — E785 Hyperlipidemia, unspecified: Secondary | ICD-10-CM

## 2021-04-24 DIAGNOSIS — E118 Type 2 diabetes mellitus with unspecified complications: Secondary | ICD-10-CM | POA: Insufficient documentation

## 2021-04-24 DIAGNOSIS — R21 Rash and other nonspecific skin eruption: Secondary | ICD-10-CM | POA: Diagnosis not present

## 2021-04-24 DIAGNOSIS — Z0000 Encounter for general adult medical examination without abnormal findings: Secondary | ICD-10-CM | POA: Diagnosis not present

## 2021-04-24 DIAGNOSIS — I10 Essential (primary) hypertension: Secondary | ICD-10-CM | POA: Diagnosis not present

## 2021-04-24 NOTE — Addendum Note (Signed)
Addended by: Deno Etienne on: 04/24/2021 09:16 AM   Modules accepted: Orders

## 2021-04-24 NOTE — Progress Notes (Addendum)
CPE  Established Patient Office Visit  Subjective:  Patient ID: Angel Krueger, male    DOB: 07-24-1946  Age: 75 y.o. MRN: [id number]  CC:  Chief Complaint  Patient presents with  . Annual Exam    HPI Angel Krueger presents for CPE.  Shingrix up to date.  Walking 2-3 miles per day.  Has a living will.  He reports that he actually came off of his blood pressure medication and his blood pressure had come down and he was no longer having headaches and so he stopped it on his own.  He has been walking for about 3 miles every day he is really try to cut back on his salt intake and wants to know what else he can do to reduce his blood pressure.  He says that he has stress test done maybe about 2 years ago.  No recent chest pain or shortness of breath.  He does occasionally take vitamin D but not daily.  Past Medical History:  Diagnosis Date  . Generalized headaches     Past Surgical History:  Procedure Laterality Date  . COLON SURGERY     Unknown specifics    Family History  Problem Relation Age of Onset  . Glaucoma Mother     Social History   Socioeconomic History  . Marital status: Married    Spouse name: Angel Krueger  . Number of children: 2  . Years of education: 63  . Highest education level: Some college, no degree  Occupational History  . Occupation: Statistician    Comment: retired  Tobacco Use  . Smoking status: Never Smoker  . Smokeless tobacco: Never Used  Vaping Use  . Vaping Use: Never used  Substance and Sexual Activity  . Alcohol use: Not Currently  . Drug use: No  . Sexual activity: Yes  Other Topics Concern  . Not on file  Social History Narrative   Work In the garden   Clean yard. Walks now since gym isn't open   Social Determinants of Health   Financial Resource Strain: Not on file  Food Insecurity: Not on file  Transportation Needs: Not on file  Physical Activity: Not on file  Stress: Not on file  Social Connections: Not on file  Intimate Partner  Violence: Not on file    Outpatient Medications Prior to Visit  Medication Sig Dispense Refill  . dorzolamide (TRUSOPT) 2 % ophthalmic solution 1 drop 3 (three) times daily.    . dorzolamide-timolol (COSOPT) 22.3-6.8 MG/ML ophthalmic solution Apply to eye.    . latanoprost (XALATAN) 0.005 % ophthalmic solution     . AMBULATORY NON FORMULARY MEDICATION Medication Name: Shingrix IM x 1. Repeat dose in 2-6 months. 1 vial 0  . propranolol (INDERAL) 80 MG tablet Take 40 mg by mouth daily.     No facility-administered medications prior to visit.    Allergies  Allergen Reactions  . Penicillins     ROS Review of Systems    Objective:    Physical Exam Constitutional:      Appearance: He is well-developed.  HENT:     Head: Normocephalic and atraumatic.     Right Ear: External ear normal.     Left Ear: External ear normal.     Nose: Nose normal.  Eyes:     Conjunctiva/sclera: Conjunctivae normal.     Pupils: Pupils are equal, round, and reactive to light.  Neck:     Thyroid: No thyromegaly.     Vascular: No carotid bruit.  Cardiovascular:     Rate and Rhythm: Normal rate and regular rhythm.     Heart sounds: Normal heart sounds.  Pulmonary:     Effort: Pulmonary effort is normal.     Breath sounds: Normal breath sounds.  Abdominal:     General: Bowel sounds are normal. There is no distension.     Palpations: Abdomen is soft. There is no mass.     Tenderness: There is no abdominal tenderness. There is no guarding or rebound.  Musculoskeletal:        General: Normal range of motion.     Cervical back: Normal range of motion and neck supple.  Lymphadenopathy:     Cervical: No cervical adenopathy.  Skin:    General: Skin is warm and dry.  Neurological:     Mental Status: He is alert and oriented to person, place, and time.     Deep Tendon Reflexes: Reflexes are normal and symmetric.  Psychiatric:        Behavior: Behavior normal.        Thought Content: Thought content  normal.        Judgment: Judgment normal.     BP 134/74   Pulse (!) 58   Ht 5\' 7"  (1.702 m)   Wt 158 lb (71.7 kg)   SpO2 100%   BMI 24.75 kg/m  Wt Readings from Last 3 Encounters:  04/24/21 158 lb (71.7 kg)  06/21/20 158 lb (71.7 kg)  05/22/20 159 lb (72.1 kg)     There are no preventive care reminders to display for this patient.  There are no preventive care reminders to display for this patient.  Lab Results  Component Value Date   TSH 2.40 10/08/2019   Lab Results  Component Value Date   WBC 5.5 05/22/2020   HGB 16.3 05/22/2020   HCT 47.6 05/22/2020   MCV 85.3 05/22/2020   PLT 171 05/22/2020   Lab Results  Component Value Date   NA 139 05/22/2020   K 4.8 05/22/2020   CO2 30 05/22/2020   GLUCOSE 116 (H) 05/22/2020   BUN 10 05/22/2020   CREATININE 0.95 05/22/2020   BILITOT 0.8 05/22/2020   ALKPHOS 50 10/07/2016   AST 21 05/22/2020   ALT 15 05/22/2020   PROT 7.0 05/22/2020   ALBUMIN 4.2 10/07/2016   CALCIUM 9.2 05/22/2020   Lab Results  Component Value Date   CHOL 183 05/22/2020   Lab Results  Component Value Date   HDL 46 05/22/2020   Lab Results  Component Value Date   LDLCALC 119 (H) 05/22/2020   Lab Results  Component Value Date   TRIG 81 05/22/2020   Lab Results  Component Value Date   CHOLHDL 4.0 05/22/2020   Lab Results  Component Value Date   HGBA1C 6.5 (H) 05/22/2020      Assessment & Plan:   Problem List Items Addressed This Visit      Endocrine   IFG (impaired fasting glucose)   Relevant Orders   Hemoglobin A1c     Other   Hyperlipidemia with target LDL less than 130 (Chronic)    Other Visit Diagnoses    Wellness examination    -  Primary   Relevant Orders   COMPLETE METABOLIC PANEL WITH GFR   Lipid panel   CBC   PSA   Essential hypertension       Relevant Orders   COMPLETE METABOLIC PANEL WITH GFR   Lipid panel   CBC  PSA   Thrombocytopenia (HCC)       Relevant Orders   CBC   Screening for prostate  cancer       Relevant Orders   PSA     Keep up a regular exercise program and make sure you are eating a healthy diet Try to eat 4 servings of dairy a day, or if you are lactose intolerant take a calcium with vitamin D daily.  Your vaccines are up to date.   Did note a rash on his right arm lateral to the antecubital fossa.  He says he gets it every summer it really is not bothersome he says it does look better when he uses a moisturizer.  But this year he noticed a similar rash right around the nape of the hairline.  He says he is never had it there before.  Skin scraping performed to rule out fungal elements if negative will treat with topical steroid cream and continue to moisturize regularly.  Consistent with eczema.  No orders of the defined types were placed in this encounter.   Follow-up: Return in about 6 months (around 10/25/2021) for check Blood pressure and migraines - nurse visit.    Nani Gasser, MD

## 2021-04-24 NOTE — Patient Instructions (Addendum)
You can try Debrox drops for your right ear to soften the wax.   Health Maintenance After Age 74 After age 76, you are at a higher risk for certain long-term diseases and infections as well as injuries from falls. Falls are a major cause of broken bones and head injuries in people who are older than age 56. Getting regular preventive care can help to keep you healthy and well. Preventive care includes getting regular testing and making lifestyle changes as recommended by your health care provider. Talk with your health care provider about:  Which screenings and tests you should have. A screening is a test that checks for a disease when you have no symptoms.  A diet and exercise plan that is right for you. What should I know about screenings and tests to prevent falls? Screening and testing are the best ways to find a health problem early. Early diagnosis and treatment give you the best chance of managing medical conditions that are common after age 37. Certain conditions and lifestyle choices may make you more likely to have a fall. Your health care provider may recommend:  Regular vision checks. Poor vision and conditions such as cataracts can make you more likely to have a fall. If you wear glasses, make sure to get your prescription updated if your vision changes.  Medicine review. Work with your health care provider to regularly review all of the medicines you are taking, including over-the-counter medicines. Ask your health care provider about any side effects that may make you more likely to have a fall. Tell your health care provider if any medicines that you take make you feel dizzy or sleepy.  Osteoporosis screening. Osteoporosis is a condition that causes the bones to get weaker. This can make the bones weak and cause them to break more easily.  Blood pressure screening. Blood pressure changes and medicines to control blood pressure can make you feel dizzy.  Strength and balance checks.  Your health care provider may recommend certain tests to check your strength and balance while standing, walking, or changing positions.  Foot health exam. Foot pain and numbness, as well as not wearing proper footwear, can make you more likely to have a fall.  Depression screening. You may be more likely to have a fall if you have a fear of falling, feel emotionally low, or feel unable to do activities that you used to do.  Alcohol use screening. Using too much alcohol can affect your balance and may make you more likely to have a fall. What actions can I take to lower my risk of falls? General instructions  Talk with your health care provider about your risks for falling. Tell your health care provider if: ? You fall. Be sure to tell your health care provider about all falls, even ones that seem minor. ? You feel dizzy, sleepy, or off-balance.  Take over-the-counter and prescription medicines only as told by your health care provider. These include any supplements.  Eat a healthy diet and maintain a healthy weight. A healthy diet includes low-fat dairy products, low-fat (lean) meats, and fiber from whole grains, beans, and lots of fruits and vegetables. Home safety  Remove any tripping hazards, such as rugs, cords, and clutter.  Install safety equipment such as grab bars in bathrooms and safety rails on stairs.  Keep rooms and walkways well-lit. Activity  Follow a regular exercise program to stay fit. This will help you maintain your balance. Ask your health care provider what  types of exercise are appropriate for you.  If you need a cane or walker, use it as recommended by your health care provider.  Wear supportive shoes that have nonskid soles.   Lifestyle  Do not drink alcohol if your health care provider tells you not to drink.  If you drink alcohol, limit how much you have: ? 0-1 drink a day for women. ? 0-2 drinks a day for men.  Be aware of how much alcohol is in your  drink. In the [place] one drink equals one typical bottle of beer (12 oz), one-half glass of wine (5 oz), or one shot of hard liquor (1 oz).  Do not use any products that contain nicotine or tobacco, such as cigarettes and e-cigarettes. If you need help quitting, ask your health care provider. Summary  Having a healthy lifestyle and getting preventive care can help to protect your health and wellness after age 61.  Screening and testing are the best way to find a health problem early and help you avoid having a fall. Early diagnosis and treatment give you the best chance for managing medical conditions that are more common for people who are older than age 41.  Falls are a major cause of broken bones and head injuries in people who are older than age 41. Take precautions to prevent a fall at home.  Work with your health care provider to learn what changes you can make to improve your health and wellness and to prevent falls. This information is not intended to replace advice given to you by your health care provider. Make sure you discuss any questions you have with your health care provider. Document Revised: 03/18/2019 Document Reviewed: 10/08/2017 Elsevier Patient Education  2021 [employer].

## 2021-04-25 LAB — LIPID PANEL
Cholesterol: 206 mg/dL — ABNORMAL HIGH (ref <200)
HDL: 45 mg/dL (ref >=40)
LDL Cholesterol (Calc): 140 mg/dL (calc) — ABNORMAL HIGH
Non-HDL Cholesterol (Calc): 161 mg/dL (calc) — ABNORMAL HIGH (ref <130)
Total CHOL/HDL Ratio: 4.6 (calc) (ref <5.0)
Triglycerides: 100 mg/dL (ref <150)

## 2021-04-25 LAB — HEMOGLOBIN A1C
Hgb A1c MFr Bld: 6.3 % of total Hgb — ABNORMAL HIGH (ref <5.7)
Mean Plasma Glucose: 134 mg/dL
eAG (mmol/L): 7.4 mmol/L

## 2021-04-25 LAB — COMPLETE METABOLIC PANEL WITH GFR
AG Ratio: 1.4 (calc) (ref 1.0–2.5)
ALT: 14 U/L (ref 9–46)
AST: 22 U/L (ref 10–35)
Albumin: 4.4 g/dL (ref 3.6–5.1)
Alkaline phosphatase (APISO): 55 U/L (ref 35–144)
BUN: 16 mg/dL (ref 7–25)
CO2: 29 mmol/L (ref 20–32)
Calcium: 9.3 mg/dL (ref 8.6–10.3)
Chloride: 105 mmol/L (ref 98–110)
Creat: 0.99 mg/dL (ref 0.70–1.18)
GFR, Est African American: 87 mL/min/{1.73_m2} (ref >=60)
GFR, Est Non African American: 75 mL/min/{1.73_m2} (ref >=60)
Globulin: 3.2 g/dL (calc) (ref 1.9–3.7)
Glucose, Bld: 107 mg/dL — ABNORMAL HIGH (ref 65–99)
Potassium: 4.7 mmol/L (ref 3.5–5.3)
Sodium: 139 mmol/L (ref 135–146)
Total Bilirubin: 0.7 mg/dL (ref 0.2–1.2)
Total Protein: 7.6 g/dL (ref 6.1–8.1)

## 2021-04-25 LAB — CBC
HCT: 48.6 % (ref 38.5–50.0)
Hemoglobin: 16 g/dL (ref 13.2–17.1)
MCH: 28.9 pg (ref 27.0–33.0)
MCHC: 32.9 g/dL (ref 32.0–36.0)
MCV: 87.7 fL (ref 80.0–100.0)
MPV: 11.4 fL (ref 7.5–12.5)
Platelets: 165 10*3/uL (ref 140–400)
RBC: 5.54 10*6/uL (ref 4.20–5.80)
RDW: 12.4 % (ref 11.0–15.0)
WBC: 5.8 10*3/uL (ref 3.8–10.8)

## 2021-04-25 LAB — PSA: PSA: 0.8 ng/mL (ref <=4.00)

## 2021-04-26 LAB — FUNGAL STAIN
FUNGAL SMEAR:: NONE SEEN
MICRO NUMBER:: 11902203
SPECIMEN QUALITY:: ADEQUATE

## 2021-04-27 MED ORDER — TRIAMCINOLONE ACETONIDE 0.1 % EX CREA: 1.0000 "application " | TOPICAL_CREAM | Freq: Every day | CUTANEOUS | 1 refills | Status: DC | PRN

## 2021-04-27 NOTE — Addendum Note (Signed)
Addended by: Nani Gasser D on: 04/27/2021 09:48 AM   Modules accepted: Orders

## 2021-05-17 DIAGNOSIS — U071 COVID-19: Secondary | ICD-10-CM | POA: Diagnosis not present

## 2021-05-17 DIAGNOSIS — Z20822 Contact with and (suspected) exposure to covid-19: Secondary | ICD-10-CM | POA: Diagnosis not present

## 2021-05-25 ENCOUNTER — Telehealth: Payer: Self-pay | Admitting: Family Medicine

## 2021-05-25 NOTE — Chronic Care Management (AMB) (Signed)
  Chronic Care Management   Outreach Note  05/25/2021 Name: Angel Krueger MRN: [id number] DOB: 1946-11-17  Referred by: Agapito Games, MD Reason for referral : No chief complaint on file.   An unsuccessful telephone outreach was attempted today. The patient was referred to the pharmacist for assistance with care management and care coordination.   Follow Up Plan:   Carmell [place] Upstream Scheduler

## 2021-06-08 ENCOUNTER — Telehealth: Payer: Self-pay | Admitting: Family Medicine

## 2021-06-08 NOTE — Chronic Care Management (AMB) (Signed)
  Chronic Care Management   Note  06/08/2021 Name: Angel Krueger MRN: [id number] DOB: [birth year]  Angel Krueger is a 75 y.o. year old male who is a primary care patient of Metheney, Barbarann Ehlers, MD. I reached out to Gaston Islam by phone today in response to a referral sent by [name]'s PCP, Agapito Games, MD.   [name] was given information about Chronic Care Management services today including:  CCM service includes personalized support from designated clinical staff supervised by his physician, including individualized plan of care and coordination with other care providers 24/7 contact phone numbers for assistance for urgent and routine care needs. Service will only be billed when office clinical staff spend 20 minutes or more in a month to coordinate care. Only one practitioner may furnish and bill the service in a calendar month. The patient may stop CCM services at any time (effective at the end of the month) by phone call to the office staff.   Patient agreed to services and verbal consent obtained.   Follow up plan:   Carmell [place] Upstream Scheduler

## 2021-06-08 NOTE — Progress Notes (Signed)
  Chronic Care Management   Outreach Note  06/08/2021 Name: Angel Krueger MRN: [id number] DOB: [birth year]  Referred by: Agapito Games, MD Reason for referral : No chief complaint on file.   A second unsuccessful telephone outreach was attempted today. The patient was referred to pharmacist for assistance with care management and care coordination.  Follow Up Plan:   Carmell [place] Upstream Scheduler

## 2021-07-06 ENCOUNTER — Telehealth: Payer: Self-pay | Admitting: Pharmacist

## 2021-07-06 NOTE — Chronic Care Management (AMB) (Signed)
    Chronic Care Management Pharmacy Assistant   Name: Angel Krueger  MRN: [id number] DOB: 10-Jul-1946  Angel Krueger is an 75 y.o. year old male who presents for his initial CCM visit with the clinical pharmacist.  Reason for Encounter: Initial CCM Visit  Recent office visits:  04/24/21- Nani Gasser, MD- seen for annual examination, started triamcinolone acetonide 0.1% daily, labs ordered, follow up 6 months   Recent consult visits:  No visits noted   Hospital visits:  None in previous 6 months  Medications: Outpatient Encounter Medications as of 07/06/2021  Medication Sig   dorzolamide (TRUSOPT) 2 % ophthalmic solution 1 drop 3 (three) times daily.   dorzolamide-timolol (COSOPT) 22.3-6.8 MG/ML ophthalmic solution Apply to eye.   latanoprost (XALATAN) 0.005 % ophthalmic solution    triamcinolone cream (KENALOG) 0.1 % Apply 1 application topically daily as needed.   No facility-administered encounter medications on file as of 07/06/2021.   Current Documented Medications dorzolamide  2 % ophthalmic solution- 90 DS last filled 02/19/21 dorzolamide-timolol  22.3-6.8 MG/ML ophthalmic solution latanoprost  0.005 % ophthalmic solution- 90 DS last filled 05/03/21 triamcinolone cream  0.1 %-30 DS last filled 04/27/21  Purvis Kilts CPA, CMA

## 2021-07-13 ENCOUNTER — Other Ambulatory Visit: Payer: Self-pay

## 2021-07-13 ENCOUNTER — Ambulatory Visit (INDEPENDENT_AMBULATORY_CARE_PROVIDER_SITE_OTHER): Payer: Medicare HMO | Admitting: Pharmacist

## 2021-07-13 DIAGNOSIS — E785 Hyperlipidemia, unspecified: Secondary | ICD-10-CM | POA: Diagnosis not present

## 2021-07-13 DIAGNOSIS — H402290 Chronic angle-closure glaucoma, unspecified eye, stage unspecified: Secondary | ICD-10-CM | POA: Diagnosis not present

## 2021-07-13 NOTE — Patient Instructions (Addendum)
Cholesterol Content in Foods Cholesterol is a waxy, fat-like substance that helps to carry fat in the blood. The body needs cholesterol in small amounts, but too much cholesterol can causedamage to the arteries and heart. Most people should eat less than 200 milligrams (mg) of cholesterol a day. Foods with cholesterol  Cholesterol is found in animal-based foods, such as meat, seafood, and dairy. Generally, low-fat dairy and lean meats have less cholesterol than full-fat dairy and fatty meats. The milligrams of cholesterol per serving (mg per serving) of common cholesterol-containing foods are listed below. Meat and other proteins Egg -- one large whole egg has 186 mg. Veal shank -- 4 oz has 141 mg. Lean ground turkey (93% lean) -- 4 oz has 118 mg. Fat-trimmed lamb loin -- 4 oz has 106 mg. Lean ground beef (90% lean) -- 4 oz has 100 mg. Lobster -- 3.5 oz has 90 mg. Pork loin chops -- 4 oz has 86 mg. Canned salmon -- 3.5 oz has 83 mg. Fat-trimmed beef top loin -- 4 oz has 78 mg. Frankfurter -- 1 frank (3.5 oz) has 77 mg. Crab -- 3.5 oz has 71 mg. Roasted chicken without skin, white meat -- 4 oz has 66 mg. Light bologna -- 2 oz has 45 mg. Deli-cut turkey -- 2 oz has 31 mg. Canned tuna -- 3.5 oz has 31 mg. Bacon -- 1 oz has 29 mg. Oysters and mussels (raw) -- 3.5 oz has 25 mg. Mackerel -- 1 oz has 22 mg. Trout -- 1 oz has 20 mg. Pork sausage -- 1 link (1 oz) has 17 mg. Salmon -- 1 oz has 16 mg. Tilapia -- 1 oz has 14 mg. Dairy Soft-serve ice cream --  cup (4 oz) has 103 mg. Whole-milk yogurt -- 1 cup (8 oz) has 29 mg. Cheddar cheese -- 1 oz has 28 mg. American cheese -- 1 oz has 28 mg. Whole milk -- 1 cup (8 oz) has 23 mg. 2% milk -- 1 cup (8 oz) has 18 mg. Cream cheese -- 1 tablespoon (Tbsp) has 15 mg. Cottage cheese --  cup (4 oz) has 14 mg. Low-fat (1%) milk -- 1 cup (8 oz) has 10 mg. Sour cream -- 1 Tbsp has 8.5 mg. Low-fat yogurt -- 1 cup (8 oz) has 8 mg. Nonfat Greek  yogurt -- 1 cup (8 oz) has 7 mg. Half-and-half cream -- 1 Tbsp has 5 mg. Fats and oils Cod liver oil -- 1 tablespoon (Tbsp) has 82 mg. Butter -- 1 Tbsp has 15 mg. Lard -- 1 Tbsp has 14 mg. Bacon grease -- 1 Tbsp has 14 mg. Mayonnaise -- 1 Tbsp has 5-10 mg. Margarine -- 1 Tbsp has 3-10 mg. Exact amounts of cholesterol in these foods may vary depending on specificingredients and brands. Foods without cholesterol Most plant-based foods do not have cholesterol unless you combine them with a food that has cholesterol. Foods without cholesterol include: Grains and cereals. Vegetables. Fruits. Vegetable oils, such as olive, canola, and sunflower oil. Legumes, such as peas, beans, and lentils. Nuts and seeds. Egg whites. Summary The body needs cholesterol in small amounts, but too much cholesterol can cause damage to the arteries and heart. Most people should eat less than 200 milligrams (mg) of cholesterol a day. This information is not intended to replace advice given to you by your health care provider. Make sure you discuss any questions you have with your healthcare provider. Document Revised: 03/07/2020 Document Reviewed: 04/17/2020 Elsevier Patient Education  2022   Elsevier Inc[employer]  Visit Information   PATIENT GOALS:   Goals Addressed             This Visit's Progress    Medication Management       Patient Goals/Self-Care Activities Over the next 180 days, patient will:  take medications as prescribed  Follow Up Plan: Telephone follow up appointment with care management team member scheduled for:  6 months         Consent to CCM Services: Angel Krueger was given information about Chronic Care Management services including:  CCM service includes personalized support from designated clinical staff supervised by his physician, including individualized plan of care and coordination with other care providers 24/7 contact phone numbers for assistance for urgent and routine care  needs. Service will only be billed when office clinical staff spend 20 minutes or more in a month to coordinate care. Only one practitioner may furnish and bill the service in a calendar month. The patient may stop CCM services at any time (effective at the end of the month) by phone call to the office staff. The patient will be responsible for cost sharing (co-pay) of up to 20% of the service fee (after annual deductible is met).  Patient agreed to services and verbal consent obtained.   Patient verbalizes understanding of instructions provided today and agrees to view in [place].   Telephone follow up appointment with care management team member scheduled for: 6 months  [place]: Patient Care Plan: Medication Management     Problem Identified: HLD, glaucoma      Long-Range Goal: Disease Progression Prevention   Start Date: 07/13/2021  This Visit's Progress: On track  Priority: High  Note:   Current Barriers:  Unable to achieve control of hyperlipidemia   Pharmacist Clinical Goal(s):  Over the next 180 days, patient will adhere to plan to optimize therapeutic regimen for cholesterol as evidenced by report of adherence to recommended medication management changes through collaboration with PharmD and provider.   Interventions: 1:1 collaboration with Hali Marry, MD regarding development and update of comprehensive plan of care as evidenced by provider attestation and co-signature Inter-disciplinary care team collaboration (see longitudinal plan of care) Comprehensive medication review performed; medication list updated in electronic medical record  Hyperlipidemia:  Uncontrolled; current treatment:Lifestyle/diet modifications only at this time;   Current dietary patterns: vegetarian, already limits oily/greasy foods, only has eggs every 3-6 months not frequently  Counseled on risk factors of prediabetes as well as ASCVD >20%. This, in  addition to the patient's otherwise remarkable health, would suggest patient would likely benefit from statin therapy and 10 year risk reduction. Also discussed diet extensively, which revealed that patient is already naturally eliminating most of what would be most contributory to cholesterol in diet. Recommended consider rosuvastatin 26m and titrate with goal dose of 10-274mwith ideal lowering to LDL <100  Glaucoma  Controlled; current treatment:brimonidine, trusopt, and xalatan eye drops managed per opthalmologist ;   Recommended continue current regimen  Patient Goals/Self-Care Activities Over the next 180 days, patient will:  take medications as prescribed  Follow Up Plan: Telephone follow up appointment with care management team member scheduled for:  6 months

## 2021-07-13 NOTE — Progress Notes (Signed)
 Chronic Care Management Pharmacy Note  07/13/2021 Name:  Angel Krueger MRN:  [id number] DOB:  12/14/1945  Summary: addressed HLD, glaucoma  Recommendations/Changes made from today's visit: based on concomitant prediabetes and ASCVD >20%, in addition to patient otherwise great health + diet, would consider rosuvastatin 5mg and titrate to goal dose of 10-20mg with ideal lowering to LDL<100. Discussed w/ patient who is amenable to medication therapy. Recommend repeat lipid panel at next PCP visit in October.  Plan: f/u with pharmacist in 6 months  Subjective: Angel Krueger is an 74 y.o. year old male who is a primary patient of Metheney, Catherine D, MD.  The CCM team was consulted for assistance with disease management and care coordination needs.    Engaged with patient by telephone for initial visit in response to provider referral for pharmacy case management and/or care coordination services.   Consent to Services:  The patient was given information about Chronic Care Management services, agreed to services, and gave verbal consent prior to initiation of services.  Please see initial visit note for detailed documentation.   Patient Care Team: Metheney, Catherine D, MD as PCP - General (Family Medicine) Kline, Keesha J, RPH as Pharmacist (Pharmacist)  Recent office visits:  04/24/21- Catherine Metheney, MD- seen for annual examination, started triamcinolone acetonide 0.1% daily, labs ordered, follow up 6 months    Recent consult visits:  No visits noted    Hospital visits:  None in previous 6 months  Objective:  Lab Results  Component Value Date   CREATININE 0.99 04/24/2021   CREATININE 0.95 05/22/2020   CREATININE 1.00 10/08/2019    Lab Results  Component Value Date   HGBA1C 6.3 (H) 04/24/2021   Last diabetic Eye exam: No results found for: HMDIABEYEEXA  Last diabetic Foot exam: No results found for: HMDIABFOOTEX      Component Value Date/Time   CHOL 206 (H)  04/24/2021 0000   TRIG 100 04/24/2021 0000   HDL 45 04/24/2021 0000   CHOLHDL 4.6 04/24/2021 0000   VLDL 24 09/09/2016 1159   LDLCALC 140 (H) 04/24/2021 0000    Hepatic Function Latest Ref Rng & Units 04/24/2021 05/22/2020 10/08/2019  Total Protein 6.1 - 8.1 g/dL 7.6 7.0 7.5  Albumin 3.6 - 5.1 g/dL - - -  AST 10 - 35 U/L 22 21 24  ALT 9 - 46 U/L 14 15 18  Alk Phosphatase 40 - 115 U/L - - -  Total Bilirubin 0.2 - 1.2 mg/dL 0.7 0.8 0.5    Lab Results  Component Value Date/Time   TSH 2.40 10/08/2019 03:29 PM    CBC Latest Ref Rng & Units 04/24/2021 05/22/2020 10/08/2019  WBC 3.8 - 10.8 Thousand/uL 5.8 5.5 6.6  Hemoglobin 13.2 - 17.1 g/dL 16.0 16.3 17.7(H)  Hematocrit 38.5 - 50.0 % 48.6 47.6 50.9(H)  Platelets 140 - 400 Thousand/uL 165 171 137(L)     Clinical ASCVD:  The 10-year ASCVD risk score (Goff DC Jr., et al., 2013) is: 28.3%   Values used to calculate the score:     Age: 74 years     Sex: Male     Is Non-Hispanic African American: No     Diabetic: No     Tobacco smoker: No     Systolic Blood Pressure: 134 mmHg     Is BP treated: Yes     HDL Cholesterol: 45 mg/dL     Total Cholesterol: 206 mg/dL     Social History   Tobacco Use    Smoking Status Never  Smokeless Tobacco Never   BP Readings from Last 3 Encounters:  04/24/21 134/74  06/21/20 110/66  05/22/20 (!) 155/82   Pulse Readings from Last 3 Encounters:  04/24/21 (!) 58  06/21/20 60  05/22/20 61   Wt Readings from Last 3 Encounters:  04/24/21 158 lb (71.7 kg)  06/21/20 158 lb (71.7 kg)  05/22/20 159 lb (72.1 kg)    Assessment: Review of patient past medical history, allergies, medications, health status, including review of consultants reports, laboratory and other test data, was performed as part of comprehensive evaluation and provision of chronic care management services.   SDOH:  (Social Determinants of Health) assessments and interventions performed:    CCM Care Plan  Allergies  Allergen  Reactions   Penicillins     Medications Reviewed Today     Reviewed by Metheney, Catherine D, MD (Physician) on 04/24/21 at 0853  Med List Status: <None>   Medication Order Taking? Sig Documenting Provider Last Dose Status Informant  dorzolamide (TRUSOPT) 2 % ophthalmic solution [id number] Yes 1 drop 3 (three) times daily. [provider] Taking Active   dorzolamide-timolol (COSOPT) 22.3-6.8 MG/ML ophthalmic solution [id number] Yes Apply to eye. Ziel, Carol Jean, MD Taking Active   latanoprost (XALATAN) 0.005 % ophthalmic solution [id number] Yes  Ziel, Carol Jean, MD Taking Active             Patient Active Problem List   Diagnosis Date Noted   IFG (impaired fasting glucose) 04/24/2021   Weakness of left leg 01/11/2020   Polyarthralgia 11/18/2019   Lumbar spinal stenosis 10/19/2019   Noncompliance with medication regimen 08/23/2018   Transient ischemic attack (TIA) 08/23/2018   Constipation 09/09/2016   Pseudophakia of both eyes 03/14/2015   Chronic angle-closure glaucoma 03/13/2015   Miosis 03/13/2015   Cataract, nuclear sclerotic, left eye 03/09/2015   Cataract cortical, senile, left 03/09/2015   Hyperplastic colon polyp 04/20/2013   HTN (hypertension) 03/15/2013   Hyperlipidemia with target LDL less than 130 02/16/2013   History of migraine 02/15/2013    Immunization History  Administered Date(s) Administered   Hepatitis A, Adult 09/09/2016   Influenza Whole 09/08/2005   Influenza, High Dose Seasonal PF 10/24/2018, 09/06/2019   Influenza,inj,Quad PF,6+ Mos 09/09/2016   Influenza-Unspecified 08/01/2015, 09/22/2017, 09/08/2020   Janssen (J&J) SARS-COV-2 Vaccination 02/20/2020, 10/06/2020   PFIZER SARS-COV-2 Pediatric Vaccination 5-11yrs 02/25/2021   PNEUMOCOCCAL CONJUGATE-20 02/25/2021   Pneumococcal Conjugate-13 01/27/2015   Pneumococcal Polysaccharide-23 02/15/2013   Tdap 02/03/2013   Typhoid Live 07/24/2015   Zoster Recombinat (Shingrix) 06/15/2020,  09/13/2020   Zoster, Live 08/01/2015    Conditions to be addressed/monitored: HLD and glaucoma  There are no care plans that you recently modified to display for this patient.   Medication Assistance: None required.  Patient affirms current coverage meets needs.  Patient's preferred pharmacy is:  [facility] - [place], [place] - [address]  [place] [address] Phone: [phone] Fax: [phone]  [facility] - [place], [place] - [address] [place] [address] Phone: [phone] Fax: [phone]  Uses pill box? No - does not currently take PO medications Pt endorses 100% compliance  Follow Up:  Patient agrees to Care Plan and Follow-up.  Plan: Telephone follow up appointment with care management team member scheduled for:  6 months  Keesha J Kline      

## 2021-07-16 MED ORDER — ROSUVASTATIN CALCIUM 5 MG PO TABS: 5.0000 mg | ORAL_TABLET | Freq: Every day | ORAL | 1 refills | Status: DC

## 2021-07-16 NOTE — Addendum Note (Signed)
Addended by: Nani Gasser D on: 07/16/2021 09:46 AM   Modules accepted: Orders

## 2021-07-20 ENCOUNTER — Telehealth: Payer: Self-pay | Admitting: Family Medicine

## 2021-07-20 NOTE — Telephone Encounter (Signed)
Patient wanted to come in to be seen with someone on Monday. Only acute slots open.

## 2021-07-20 NOTE — Telephone Encounter (Signed)
No, I did not see him for these so I am not going to take responsibility for testing for a patient that I did not personally evaluate.

## 2021-07-20 NOTE — Telephone Encounter (Signed)
Appointment has been made

## 2021-07-20 NOTE — Telephone Encounter (Signed)
Wife calling in wanting to know if he can have an xray as well. States that they have similar symptoms and an order was placed for her already.

## 2021-07-23 ENCOUNTER — Other Ambulatory Visit: Payer: Self-pay

## 2021-07-23 ENCOUNTER — Encounter: Payer: Self-pay | Admitting: Family Medicine

## 2021-07-23 ENCOUNTER — Ambulatory Visit (INDEPENDENT_AMBULATORY_CARE_PROVIDER_SITE_OTHER): Payer: Medicare HMO | Admitting: Family Medicine

## 2021-07-23 VITALS — BP 175/94 | HR 58 | Temp 98.3°F | Ht 67.0 in | Wt 160.0 lb

## 2021-07-23 DIAGNOSIS — I10 Essential (primary) hypertension: Secondary | ICD-10-CM

## 2021-07-23 DIAGNOSIS — R058 Other specified cough: Secondary | ICD-10-CM | POA: Diagnosis not present

## 2021-07-23 MED ORDER — BENZONATATE 200 MG PO CAPS: 200.0000 mg | ORAL_CAPSULE | Freq: Three times a day (TID) | ORAL | 0 refills | Status: DC | PRN

## 2021-07-23 MED ORDER — LOSARTAN POTASSIUM 25 MG PO TABS: 25.0000 mg | ORAL_TABLET | Freq: Every day | ORAL | 0 refills | Status: DC

## 2021-07-23 MED ORDER — PANTOPRAZOLE SODIUM 40 MG PO TBEC: 40.0000 mg | DELAYED_RELEASE_TABLET | Freq: Every day | ORAL | 0 refills | Status: DC

## 2021-07-23 NOTE — Assessment & Plan Note (Signed)
Blood pressure incredibly elevated today it has been high in the past as well but not usually this high.  So we discussed going ahead and starting an ARB.  We will start losartan.  Recommend follow-up in a couple of weeks for nurse blood pressure check as well as a BMP.
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
## 2021-08-07 ENCOUNTER — Ambulatory Visit: Payer: Medicare HMO

## 2021-09-10 DIAGNOSIS — H26492 Other secondary cataract, left eye: Secondary | ICD-10-CM | POA: Diagnosis not present

## 2021-09-10 DIAGNOSIS — H4052X2 Glaucoma secondary to other eye disorders, left eye, moderate stage: Secondary | ICD-10-CM | POA: Diagnosis not present

## 2021-09-10 DIAGNOSIS — Z961 Presence of intraocular lens: Secondary | ICD-10-CM | POA: Diagnosis not present

## 2021-09-10 DIAGNOSIS — H4051X3 Glaucoma secondary to other eye disorders, right eye, severe stage: Secondary | ICD-10-CM | POA: Diagnosis not present

## 2021-10-25 ENCOUNTER — Ambulatory Visit: Payer: Medicare HMO | Admitting: Family Medicine

## 2021-12-25 ENCOUNTER — Telehealth: Payer: Medicare HMO

## 2021-12-25 NOTE — Progress Notes (Deleted)
Chronic Care Management Pharmacy Note  12/25/2021 Name:  Angel Krueger MRN:  [id number] DOB:  1946/08/29  Summary: addressed HLD, glaucoma  Recommendations/Changes made from today's visit: based on concomitant prediabetes and ASCVD >20%, in addition to patient otherwise great health + diet, would consider rosuvastatin 28m and titrate to goal dose of 10-248mwith ideal lowering to LDL<100. Discussed w/ patient who is amenable to medication therapy. Recommend repeat lipid panel at next PCP visit in October.  Plan: f/u with pharmacist in 6 months  Subjective: Angel Krueger an [age].o. year old male who is a primary patient of Metheney, CaRene KocherMD.  The CCM team was consulted for assistance with disease management and care coordination needs.    Engaged with patient by telephone for initial visit in response to provider referral for pharmacy case management and/or care coordination services.   Consent to Services:  The patient was given information about Chronic Care Management services, agreed to services, and gave verbal consent prior to initiation of services.  Please see initial visit note for detailed documentation.   Patient Care Team: MeHali MarryMD as PCP - General (Family Medicine) [facility] Pharmacist (Pharmacist)  Recent office visits:  04/24/21- CaBeatrice LecherMD- seen for annual examination, started triamcinolone acetonide 0.1% daily, labs ordered, follow up 6 months    Recent consult visits:  No visits noted    Hospital visits:  None in previous 6 months  Objective:  Lab Results  Component Value Date   CREATININE 0.99 04/24/2021   CREATININE 0.95 05/22/2020   CREATININE 1.00 10/08/2019    Lab Results  Component Value Date   HGBA1C 6.3 (H) 04/24/2021   Last diabetic Eye exam: No results found for: HMDIABEYEEXA  Last diabetic Foot exam: No results found for: HMDIABFOOTEX      Component Value Date/Time   CHOL 206 (H)  04/24/2021 0000   TRIG 100 04/24/2021 0000   HDL 45 04/24/2021 0000   CHOLHDL 4.6 04/24/2021 0000   VLDL 24 09/09/2016 1159   LDLCALC 140 (H) 04/24/2021 0000    Hepatic Function Latest Ref Rng & Units 04/24/2021 05/22/2020 10/08/2019  Total Protein 6.1 - 8.1 g/dL 7.6 7.0 7.5  Albumin 3.6 - 5.1 g/dL - - -  AST 10 - 35 U/L 22 21 24   ALT 9 - 46 U/L 14 15 18   Alk Phosphatase 40 - 115 U/L - - -  Total Bilirubin 0.2 - 1.2 mg/dL 0.7 0.8 0.5    Lab Results  Component Value Date/Time   TSH 2.40 10/08/2019 03:29 PM    CBC Latest Ref Rng & Units 04/24/2021 05/22/2020 10/08/2019  WBC 3.8 - 10.8 Thousand/uL 5.8 5.5 6.6  Hemoglobin 13.2 - 17.1 g/dL 16.0 16.3 17.7(H)  Hematocrit 38.5 - 50.0 % 48.6 47.6 50.9(H)  Platelets 140 - 400 Thousand/uL 165 171 137(L)     Clinical ASCVD:  The 10-year ASCVD risk score (Arnett DK, et al., 2019) is: 43.7%   Values used to calculate the score:     Age: [age]     Sex: Male     Is Non-Hispanic African American: No     Diabetic: No     Tobacco smoker: No     Systolic Blood Pressure: 17681mHg     Is BP treated: Yes     HDL Cholesterol: 45 mg/dL     Total Cholesterol: 206 mg/dL     Social History   Tobacco Use  Smoking  Status Never  Smokeless Tobacco Never   BP Readings from Last 3 Encounters:  07/23/21 (!) 175/94  04/24/21 134/74  06/21/20 110/66   Pulse Readings from Last 3 Encounters:  07/23/21 (!) 58  04/24/21 (!) 58  06/21/20 60   Wt Readings from Last 3 Encounters:  07/23/21 160 lb (72.6 kg)  04/24/21 158 lb (71.7 kg)  06/21/20 158 lb (71.7 kg)    Assessment: Review of patient past medical history, allergies, medications, health status, including review of consultants reports, laboratory and other test data, was performed as part of comprehensive evaluation and provision of chronic care management services.   SDOH:  (Social Determinants of Health) assessments and interventions performed:    CCM Care Plan  Allergies   Allergen Reactions   Penicillins     Medications Reviewed Today     Reviewed by Gust Brooms, [place] (Certified Medical Assistant) on 07/23/21 at 1021  Med List Status: <None>   Medication Order Taking? Sig Documenting Provider Last Dose Status Informant  brimonidine (ALPHAGAN) 0.2 % ophthalmic solution [id number] Yes Place 1 drop into both eyes 3 (three) times daily. [provider] Taking Active   dorzolamide (TRUSOPT) 2 % ophthalmic solution [id number] Yes 1 drop 3 (three) times daily. [provider] Taking Active   latanoprost (XALATAN) 0.005 % ophthalmic solution [id number] Yes Place 1 drop into both eyes at bedtime. Dimas Alexandria, MD Taking Active   rosuvastatin (CRESTOR) 5 MG tablet [id number] Yes Take 1 tablet (5 mg total) by mouth daily. Hali Marry, MD Taking Active   triamcinolone cream (KENALOG) 0.1 % [id number] Yes Apply 1 application topically daily as needed. Hali Marry, MD Taking Active             Patient Active Problem List   Diagnosis Date Noted   IFG (impaired fasting glucose) 04/24/2021   Weakness of left leg 01/11/2020   Polyarthralgia 11/18/2019   Lumbar spinal stenosis 10/19/2019   Noncompliance with medication regimen 08/23/2018   Transient ischemic attack (TIA) 08/23/2018   Constipation 09/09/2016   Pseudophakia of both eyes 03/14/2015   Chronic angle-closure glaucoma 03/13/2015   Miosis 03/13/2015   Cataract, nuclear sclerotic, left eye 03/09/2015   Cataract cortical, senile, left 03/09/2015   Hyperplastic colon polyp 04/20/2013   HTN (hypertension) 03/15/2013   Hyperlipidemia with target LDL less than 130 02/16/2013   History of migraine 02/15/2013    Immunization History  Administered Date(s) Administered   Hepatitis A, Adult 09/09/2016   Influenza Whole 09/08/2005   Influenza, High Dose Seasonal PF 10/24/2018, 09/06/2019   Influenza,inj,Quad PF,6+ Mos 09/09/2016   Influenza-Unspecified 08/01/2015,  09/22/2017, 09/08/2020   Janssen (J&J) SARS-COV-2 Vaccination 02/20/2020, 10/06/2020   PFIZER SARS-COV-2 Pediatric Vaccination 5-48yr 02/25/2021   PNEUMOCOCCAL CONJUGATE-20 02/25/2021   Pneumococcal Conjugate-13 01/27/2015   Pneumococcal Polysaccharide-23 02/15/2013   Tdap 02/03/2013   Typhoid Live 07/24/2015   Zoster Recombinat (Shingrix) 06/15/2020, 09/13/2020   Zoster, Live 08/01/2015    Conditions to be addressed/monitored: HLD and glaucoma  There are no care plans that you recently modified to display for this patient.   Medication Assistance: None required.  Patient affirms current coverage meets needs.  Patient's [facility] pharmacy is:  [facility] [address] [id number]: [phone]: [phone] [facility] [address]Phone: [phone]: [phone] Uses pill box?  No - does not currently take PO medications Pt endorses 100% compliance  Follow Up:  Patient agrees to Care Plan and Follow-up.  Plan: Telephone follow up appointment with care management team member scheduled for:  6 months  Darius Bump

## 2021-12-26 ENCOUNTER — Telehealth: Payer: Self-pay | Admitting: Pharmacist

## 2021-12-26 NOTE — Telephone Encounter (Signed)
Unsuccessful attempt x3 to contact patient for follow-up phone call for CCM services with pharmacist - attempted both home and mobile numbers listed.  Will route to schedule team for reschedule.  Darius Bump

## 2021-12-28 NOTE — Chronic Care Management (AMB) (Signed)
°  Care Management   Note  12/28/2021 Name: Angel Krueger MRN: [id number] DOB: July 30, 1946  Angel Krueger is a 76 y.o. year old male who is a primary care patient of Agapito Games, MD and is actively engaged with the care management team. I reached out to [facility] by phone today to assist with re-scheduling a follow up visit with the Pharmacist  Follow up plan: Unsuccessful telephone outreach attempt made. A HIPAA compliant phone message was left for the patient providing contact information and requesting a return call.   Burman Nieves, CCMA Care Guide, Embedded Care Coordination [facility]   Care Management  Direct Dial: [phone]

## 2022-02-19 DIAGNOSIS — H4052X2 Glaucoma secondary to other eye disorders, left eye, moderate stage: Secondary | ICD-10-CM | POA: Diagnosis not present

## 2022-02-19 DIAGNOSIS — H4051X3 Glaucoma secondary to other eye disorders, right eye, severe stage: Secondary | ICD-10-CM | POA: Diagnosis not present

## 2022-02-19 DIAGNOSIS — H26492 Other secondary cataract, left eye: Secondary | ICD-10-CM | POA: Diagnosis not present

## 2022-02-19 DIAGNOSIS — Z961 Presence of intraocular lens: Secondary | ICD-10-CM | POA: Diagnosis not present

## 2022-02-28 ENCOUNTER — Telehealth: Payer: Self-pay | Admitting: *Deleted

## 2022-02-28 NOTE — Chronic Care Management (AMB) (Signed)
?  Care Management  ? ?Note ? ?02/28/2022 ?Name: Angel Krueger MRN: [id number] DOB: Aug 16, 1946 ? ?Angel Krueger is a 76 y.o. year old male who is a primary care patient of Hali Marry, MD and is actively engaged with the care management team. I reached out to [facility] by phone today to assist with re-scheduling a follow up visit with the Pharmacist ? ?Follow up plan: ?Unsuccessful telephone outreach attempt made. A HIPAA compliant phone message was left for the patient providing contact information and requesting a return call.  ? ?David Rodriquez, CCMA ?Care Guide, Embedded Care Coordination ?  Care Management  ?Direct Dial: [phone] ? ? ?

## 2022-03-27 NOTE — Chronic Care Management (AMB) (Signed)
?  Care Management  ? ?Note ? ?03/27/2022 ?Name: [name] MRN: [id number] DOB: 03/02/1946 ? ?Angel Krueger is a 76 y.o. year old male who is a primary care patient of Agapito Games, MD and is actively engaged with the care management team. I reached out to [facility] by phone today to assist with re-scheduling a follow up visit with the Pharmacist ? ?Follow up plan: ?2nd Unsuccessful telephone outreach attempt made. A HIPAA compliant phone message was left for the patient providing contact information and requesting a return call.  ? ?Jameca Chumley, CCMA ?Care Guide, Embedded Care Coordination ?[place]  Care Management  ?Direct Dial: [phone] ? ? ?

## 2022-04-04 NOTE — Chronic Care Management (AMB) (Signed)
?  Care Management  ? ?Note ? ?04/04/2022 ?Name: Angel Krueger MRN: [id number] DOB: [birth year] ? ?Angel Krueger is a 76 y.o. year old male who is a primary care patient of Agapito Games, MD and is actively engaged with the care management team. I reached out to [facility] by phone today to assist with re-scheduling a follow up visit with the Pharmacist ? ?Follow up plan: ?We have been unable to make contact with the patient for follow up. The care management team is available to follow up with the patient after provider conversation with the patient regarding recommendation for care management engagement and subsequent re-referral to the care management team.  ? ?Angel Krueger, CCMA ?Care Guide, Embedded Care Coordination ?[place]  Care Management  ?Direct Dial: [phone] ? ? ?

## 2022-07-01 DIAGNOSIS — H4051X3 Glaucoma secondary to other eye disorders, right eye, severe stage: Secondary | ICD-10-CM | POA: Diagnosis not present

## 2022-07-01 DIAGNOSIS — H4052X2 Glaucoma secondary to other eye disorders, left eye, moderate stage: Secondary | ICD-10-CM | POA: Diagnosis not present

## 2022-07-01 DIAGNOSIS — Z961 Presence of intraocular lens: Secondary | ICD-10-CM | POA: Diagnosis not present

## 2022-07-31 ENCOUNTER — Encounter: Payer: Self-pay | Admitting: General Practice

## 2022-08-22 ENCOUNTER — Encounter: Payer: Self-pay | Admitting: Family Medicine

## 2022-08-22 ENCOUNTER — Ambulatory Visit (INDEPENDENT_AMBULATORY_CARE_PROVIDER_SITE_OTHER): Payer: Medicare HMO

## 2022-08-22 ENCOUNTER — Ambulatory Visit (INDEPENDENT_AMBULATORY_CARE_PROVIDER_SITE_OTHER): Payer: Medicare HMO | Admitting: Family Medicine

## 2022-08-22 VITALS — BP 176/103 | HR 70 | Temp 97.7°F | Ht 67.0 in | Wt 154.0 lb

## 2022-08-22 DIAGNOSIS — R531 Weakness: Secondary | ICD-10-CM

## 2022-08-22 DIAGNOSIS — J069 Acute upper respiratory infection, unspecified: Secondary | ICD-10-CM | POA: Diagnosis not present

## 2022-08-22 DIAGNOSIS — R059 Cough, unspecified: Secondary | ICD-10-CM | POA: Diagnosis not present

## 2022-08-22 DIAGNOSIS — E785 Hyperlipidemia, unspecified: Secondary | ICD-10-CM

## 2022-08-22 DIAGNOSIS — E559 Vitamin D deficiency, unspecified: Secondary | ICD-10-CM | POA: Diagnosis not present

## 2022-08-22 DIAGNOSIS — R051 Acute cough: Secondary | ICD-10-CM

## 2022-08-22 DIAGNOSIS — I10 Essential (primary) hypertension: Secondary | ICD-10-CM | POA: Diagnosis not present

## 2022-08-22 DIAGNOSIS — R5383 Other fatigue: Secondary | ICD-10-CM | POA: Diagnosis not present

## 2022-08-22 MED ORDER — BENZONATATE 100 MG PO CAPS: 100.0000 mg | ORAL_CAPSULE | Freq: Three times a day (TID) | ORAL | 0 refills | Status: AC | PRN

## 2022-08-22 MED ORDER — LOSARTAN POTASSIUM 25 MG PO TABS: 25.0000 mg | ORAL_TABLET | Freq: Every day | ORAL | 0 refills | Status: DC

## 2022-08-22 MED ORDER — GUAIFENESIN ER 600 MG PO TB12: 600.0000 mg | ORAL_TABLET | Freq: Two times a day (BID) | ORAL | 0 refills | Status: AC

## 2022-08-22 MED ORDER — ROSUVASTATIN CALCIUM 5 MG PO TABS: 5.0000 mg | ORAL_TABLET | Freq: Every day | ORAL | 1 refills | Status: DC

## 2022-08-22 NOTE — Assessment & Plan Note (Signed)
-   given tesslon perles and mucinex for cough - on exam lung fields were clear  - have ordered Cxr to rule out pna since pt is 76 years old and has had a productive cough for two weeks.  - denied sinus symptoms, no fevers--less likely this is bacterial

## 2022-08-22 NOTE — Assessment & Plan Note (Signed)
-   discussed importance of restarting crestor and pt was agreeable. Have sent in refill  - will need lipid panel at next visit to assess efficacy and see if we need to increase dosage

## 2022-08-22 NOTE — Progress Notes (Signed)
Acute Office Visit  Subjective:     Patient ID: Angel Krueger, male    DOB: 18-May-1946, 76 y.o.   MRN: [id number]  Chief Complaint  Patient presents with   Cough   Generalized Body Aches    HPI Patient is in today for cough and body aches. Symptoms started two weeks ago. He admits to an increase in fatigue. He has tried to rest, but is not sure if this improves his fatigue. He is currently retired and stays at home with his wife. He does not have any sick contacts. Denies sinus pain/pressure. Says cough is located in his throat and it is productive. Denies any wheezing.   BP is uncontrolled at 176/103. He tells me he stopped taking his blood pressure medication 3-4 months ago. He is supposed to be on losartan 25mg . He tells me he stopped his meds because he would check his blood pressure at home and say his numbers were well controlled so he didn't think he needed it anymore. Denies chest pain, blurry vision, shortness of breath in clinic today.    Review of Systems  Constitutional:  Positive for malaise/fatigue. Negative for chills and fever.  Respiratory:  Positive for cough. Negative for shortness of breath.   Cardiovascular:  Negative for chest pain.  Neurological:  Negative for headaches.        Objective:    BP (!) 176/103   Pulse 70   Temp 97.7 F (36.5 C) (Oral)   Ht 5\' 7"  (1.702 m)   Wt 154 lb (69.9 kg)   SpO2 98%   BMI 24.12 kg/m    Physical Exam Vitals and nursing note reviewed.  Constitutional:      General: He is not in acute distress.    Appearance: Normal appearance.  HENT:     Head: Normocephalic and atraumatic.     Right Ear: External ear normal.     Left Ear: External ear normal.     Nose: Nose normal.     Mouth/Throat:     Comments: Erythema of the throat with no plaques or exudate seen Eyes:     Conjunctiva/sclera: Conjunctivae normal.  Cardiovascular:     Rate and Rhythm: Normal rate and regular rhythm.  Pulmonary:     Effort:  Pulmonary effort is normal.     Breath sounds: Normal breath sounds.  Neurological:     General: No focal deficit present.     Mental Status: He is alert and oriented to person, place, and time.  Psychiatric:        Mood and Affect: Mood normal.        Behavior: Behavior normal.        Thought Content: Thought content normal.        Judgment: Judgment normal.     No results found for any visits on 08/22/22.      Assessment & Plan:   Problem List Items Addressed This Visit       Cardiovascular and Mediastinum   HTN (hypertension) - Primary    - BP elevated at 176/103 will repeat prior to leaving  - discussed with patient that he needs to restart his losartan 25mg  and then we can discuss slowly getting off it if needed but right now BP is elevated where we need better control  - will need one month follow up with PCP to monitor for BP control with the losartan  - pt denied chest pain, blurry vision, and shortness of breath in clinic  Relevant Medications   losartan (COZAAR) 25 MG tablet   rosuvastatin (CRESTOR) 5 MG tablet   Other Relevant Orders   CBC   COMPLETE METABOLIC PANEL WITH GFR     Respiratory   Viral URI with cough    - given tesslon perles and mucinex for cough - on exam lung fields were clear  - have ordered Cxr to rule out pna since pt is 76 years old and has had a productive cough for two weeks.  - denied sinus symptoms, no fevers--less likely this is bacterial       Relevant Medications   benzonatate (TESSALON) 100 MG capsule   guaiFENesin (MUCINEX) 600 MG 12 hr tablet   Other Relevant Orders   DG Chest 2 View     Other   Hyperlipidemia with target LDL less than 130 (Chronic)    - discussed importance of restarting crestor and pt was agreeable. Have sent in refill  - will need lipid panel at next visit to assess efficacy and see if we need to increase dosage      Relevant Medications   losartan (COZAAR) 25 MG tablet   rosuvastatin (CRESTOR)  5 MG tablet   Other fatigue    - have ordered labs to see if fatigue and weakness is related to vit deficiency. He has not been seen in our clinic in about a year - CBC will help determine if he has iron deficiency anemia which could explain fatigue. Have also ordered b12 and vit d levels  -likely it could be fatigue from viral infection and his body is trying to recover      Relevant Orders   Vitamin D (25 hydroxy)   B12 and Folate Panel   TSH + free T4   Other Visit Diagnoses     Weakness           Meds ordered this encounter  Medications   benzonatate (TESSALON) 100 MG capsule    Sig: Take 1 capsule (100 mg total) by mouth 3 (three) times daily as needed for up to 7 days for cough.    Dispense:  15 capsule    Refill:  0   guaiFENesin (MUCINEX) 600 MG 12 hr tablet    Sig: Take 1 tablet (600 mg total) by mouth 2 (two) times daily for 5 days.    Dispense:  10 tablet    Refill:  0   losartan (COZAAR) 25 MG tablet    Sig: Take 1 tablet (25 mg total) by mouth daily.    Dispense:  90 tablet    Refill:  0   rosuvastatin (CRESTOR) 5 MG tablet    Sig: Take 1 tablet (5 mg total) by mouth daily.    Dispense:  90 tablet    Refill:  1    Return in about 4 weeks (around 09/19/2022) for with pcp for HTN .  Charlton Amor, DO

## 2022-08-22 NOTE — Assessment & Plan Note (Signed)
-   have ordered labs to see if fatigue and weakness is related to vit deficiency. He has not been seen in our clinic in about a year - CBC will help determine if he has iron deficiency anemia which could explain fatigue. Have also ordered b12 and vit d levels  -likely it could be fatigue from viral infection and his body is trying to recover

## 2022-08-22 NOTE — Assessment & Plan Note (Signed)
-   BP elevated at 176/103 will repeat prior to leaving  - discussed with patient that he needs to restart his losartan 25mg  and then we can discuss slowly getting off it if needed but right now BP is elevated where we need better control  - will need one month follow up with PCP to monitor for BP control with the losartan  - pt denied chest pain, blurry vision, and shortness of breath in clinic

## 2022-08-23 LAB — B12 AND FOLATE PANEL
Folate: 14 ng/mL
Vitamin B-12: 357 pg/mL (ref 200–1100)

## 2022-08-23 LAB — COMPLETE METABOLIC PANEL WITH GFR
AG Ratio: 1.4 (calc) (ref 1.0–2.5)
ALT: 13 U/L (ref 9–46)
AST: 21 U/L (ref 10–35)
Albumin: 4.4 g/dL (ref 3.6–5.1)
Alkaline phosphatase (APISO): 54 U/L (ref 35–144)
BUN: 15 mg/dL (ref 7–25)
CO2: 28 mmol/L (ref 20–32)
Calcium: 9.9 mg/dL (ref 8.6–10.3)
Chloride: 102 mmol/L (ref 98–110)
Creat: 1.09 mg/dL (ref 0.70–1.28)
Globulin: 3.2 g/dL (calc) (ref 1.9–3.7)
Glucose, Bld: 172 mg/dL — ABNORMAL HIGH (ref 65–99)
Potassium: 4.9 mmol/L (ref 3.5–5.3)
Sodium: 138 mmol/L (ref 135–146)
Total Bilirubin: 0.7 mg/dL (ref 0.2–1.2)
Total Protein: 7.6 g/dL (ref 6.1–8.1)
eGFR: 71 mL/min/{1.73_m2} (ref >=60)

## 2022-08-23 LAB — CBC
HCT: 50.7 % — ABNORMAL HIGH (ref 38.5–50.0)
Hemoglobin: 17.3 g/dL — ABNORMAL HIGH (ref 13.2–17.1)
MCH: 29.8 pg (ref 27.0–33.0)
MCHC: 34.1 g/dL (ref 32.0–36.0)
MCV: 87.3 fL (ref 80.0–100.0)
MPV: 10.9 fL (ref 7.5–12.5)
Platelets: 199 10*3/uL (ref 140–400)
RBC: 5.81 10*6/uL — ABNORMAL HIGH (ref 4.20–5.80)
RDW: 12.7 % (ref 11.0–15.0)
WBC: 6.3 10*3/uL (ref 3.8–10.8)

## 2022-08-23 LAB — TSH+FREE T4: TSH W/REFLEX TO FT4: 4.29 mIU/L (ref 0.40–4.50)

## 2022-08-23 LAB — VITAMIN D 25 HYDROXY (VIT D DEFICIENCY, FRACTURES): Vit D, 25-Hydroxy: 15 ng/mL — ABNORMAL LOW (ref 30–100)

## 2022-08-26 ENCOUNTER — Other Ambulatory Visit: Payer: Self-pay | Admitting: Family Medicine

## 2022-08-26 DIAGNOSIS — E559 Vitamin D deficiency, unspecified: Secondary | ICD-10-CM

## 2022-08-26 MED ORDER — VITAMIN D3 25 MCG (1000 UT) PO CAPS: 1000.0000 [IU] | ORAL_CAPSULE | Freq: Every day | ORAL | 0 refills | Status: DC

## 2022-09-04 ENCOUNTER — Other Ambulatory Visit: Payer: Self-pay | Admitting: Family Medicine

## 2022-09-04 DIAGNOSIS — E785 Hyperlipidemia, unspecified: Secondary | ICD-10-CM

## 2022-09-05 NOTE — Telephone Encounter (Signed)
Please call pt and inform him that he MUST follow up with PCP for OV and FASTING LABS for refills. thanks

## 2022-09-05 NOTE — Telephone Encounter (Signed)
Lvm for the patient to call back to schedule an appointment for an OV and fasting labs - tvt

## 2022-10-01 DIAGNOSIS — H524 Presbyopia: Secondary | ICD-10-CM | POA: Diagnosis not present

## 2022-10-04 DIAGNOSIS — H52209 Unspecified astigmatism, unspecified eye: Secondary | ICD-10-CM | POA: Diagnosis not present

## 2022-10-04 DIAGNOSIS — H5203 Hypermetropia, bilateral: Secondary | ICD-10-CM | POA: Diagnosis not present

## 2022-10-05 ENCOUNTER — Other Ambulatory Visit: Payer: Self-pay | Admitting: Family Medicine

## 2022-10-05 DIAGNOSIS — E559 Vitamin D deficiency, unspecified: Secondary | ICD-10-CM

## 2023-01-16 ENCOUNTER — Ambulatory Visit: Payer: Medicare HMO | Admitting: Family Medicine

## 2023-01-20 ENCOUNTER — Ambulatory Visit: Payer: Medicare HMO | Admitting: Sports Medicine

## 2023-01-21 ENCOUNTER — Encounter: Payer: Self-pay | Admitting: Family Medicine

## 2023-01-21 ENCOUNTER — Ambulatory Visit (INDEPENDENT_AMBULATORY_CARE_PROVIDER_SITE_OTHER): Payer: Medicare HMO | Admitting: Family Medicine

## 2023-01-21 VITALS — BP 161/92 | HR 72 | Ht 67.0 in | Wt 156.0 lb

## 2023-01-21 DIAGNOSIS — H4051X3 Glaucoma secondary to other eye disorders, right eye, severe stage: Secondary | ICD-10-CM | POA: Diagnosis not present

## 2023-01-21 DIAGNOSIS — E785 Hyperlipidemia, unspecified: Secondary | ICD-10-CM | POA: Diagnosis not present

## 2023-01-21 DIAGNOSIS — K625 Hemorrhage of anus and rectum: Secondary | ICD-10-CM

## 2023-01-21 DIAGNOSIS — Z1211 Encounter for screening for malignant neoplasm of colon: Secondary | ICD-10-CM | POA: Diagnosis not present

## 2023-01-21 DIAGNOSIS — I10 Essential (primary) hypertension: Secondary | ICD-10-CM

## 2023-01-21 DIAGNOSIS — R7301 Impaired fasting glucose: Secondary | ICD-10-CM

## 2023-01-21 DIAGNOSIS — E559 Vitamin D deficiency, unspecified: Secondary | ICD-10-CM | POA: Diagnosis not present

## 2023-01-21 DIAGNOSIS — H4052X2 Glaucoma secondary to other eye disorders, left eye, moderate stage: Secondary | ICD-10-CM | POA: Diagnosis not present

## 2023-01-21 DIAGNOSIS — Z8669 Personal history of other diseases of the nervous system and sense organs: Secondary | ICD-10-CM

## 2023-01-21 DIAGNOSIS — Z961 Presence of intraocular lens: Secondary | ICD-10-CM | POA: Diagnosis not present

## 2023-01-21 MED ORDER — LOSARTAN POTASSIUM 25 MG PO TABS: 25.0000 mg | ORAL_TABLET | Freq: Every day | ORAL | 1 refills | Status: DC

## 2023-01-21 MED ORDER — VITAMIN D3 25 MCG (1000 UT) PO CAPS: 1000.0000 [IU] | ORAL_CAPSULE | Freq: Every day | ORAL | 0 refills | Status: DC

## 2023-01-21 MED ORDER — ROSUVASTATIN CALCIUM 5 MG PO TABS: 5.0000 mg | ORAL_TABLET | Freq: Every day | ORAL | 3 refills | Status: DC

## 2023-01-21 MED ORDER — LOSARTAN POTASSIUM 50 MG PO TABS: 50.0000 mg | ORAL_TABLET | Freq: Every day | ORAL | 3 refills | Status: DC

## 2023-01-21 NOTE — Assessment & Plan Note (Signed)
Increase losartan to 50 mg.  New prescription sent to pharmacy.  He reports home blood pressures have been more in the 1 35-1 40 range but we discussed that the goal is to keep the blood pressures consistently under 140.  Keep up the healthy diet and regular exercise.

## 2023-01-21 NOTE — Progress Notes (Signed)
Acute Office Visit  Subjective:     Patient ID: Angel Krueger, male    DOB: [birth year], 77 y.o.   MRN: [id number]  Chief Complaint  Patient presents with   Stool Color Change    HPI He is here today because he is reports that he has noticed a little pink tinge when he wipes after bowel movements it has been coming and going for couple of weeks.  He denies any pain.  No rectal pain.  No abdominal pain.  No cramps.  No fevers or chills.  His colonoscopy was in 2014.  He is never been told if he has hemorrhoids or not.  Hypertension- Pt denies chest pain, SOB, dizziness, or heart palpitations.  Taking meds as directed w/o problems.  Denies medication side effects.  Home BP 135-140.    ROS      Objective:    BP (!) 161/92 (BP Location: Left Arm, Patient Position: Sitting, Cuff Size: Small)   Pulse 72   Ht 5' 7"$  (1.702 m)   Wt 156 lb 0.6 oz (70.8 kg)   SpO2 98%   BMI 24.44 kg/m    Physical Exam Constitutional:      Appearance: He is well-developed.  HENT:     Head: Normocephalic and atraumatic.  Neck:     Vascular: No carotid bruit.  Cardiovascular:     Rate and Rhythm: Normal rate and regular rhythm.     Heart sounds: Normal heart sounds.     Comments: Prominent 2nd heart sound Pulmonary:     Effort: Pulmonary effort is normal.     Breath sounds: Normal breath sounds.  Musculoskeletal:     Cervical back: No tenderness.  Lymphadenopathy:     Cervical: No cervical adenopathy.  Skin:    General: Skin is warm and dry.  Neurological:     Mental Status: He is alert and oriented to person, place, and time.  Psychiatric:        Behavior: Behavior normal.     No results found for any visits on 01/21/23.      Assessment & Plan:   Problem List Items Addressed This Visit       Cardiovascular and Mediastinum   HTN (hypertension)    Increase losartan to 50 mg.  New prescription sent to pharmacy.  He reports home blood pressures have been more in the 1 35-1 40  range but we discussed that the goal is to keep the blood pressures consistently under 140.  Keep up the healthy diet and regular exercise.      Relevant Medications   rosuvastatin (CRESTOR) 5 MG tablet   losartan (COZAAR) 50 MG tablet   Other Relevant Orders   BASIC METABOLIC PANEL WITH GFR     Endocrine   IFG (impaired fasting glucose)    A1c due to be rechecked today it has been a most a year since it was last evaluated.  Plan to recheck again in 6 months if he is still at goal.      Relevant Orders   Hemoglobin 123456   BASIC METABOLIC PANEL WITH GFR     Other   Hyperlipidemia with target LDL less than 130 (Chronic)   Relevant Medications   rosuvastatin (CRESTOR) 5 MG tablet   losartan (COZAAR) 50 MG tablet   History of migraine (Chronic)   Other Visit Diagnoses     Vitamin D deficiency    -  Primary   Relevant Medications   Cholecalciferol (VITAMIN D3)  25 MCG (1000 UT) capsule   Other Relevant Orders   VITAMIN D 25 Hydroxy (Vit-D Deficiency, Fractures)   Rectal bleeding       Relevant Orders   Ambulatory referral to Gastroenterology   Colon cancer screening       Relevant Orders   Ambulatory referral to Gastroenterology       Blood noted after wiping after bowel movement-he is due for his 10-year colonoscopy so we discussed option of doing an exam here or just getting him back in with GI for further evaluation since he is due for his scope anyway.  He would like to move forward with GI referral.  Time we checked vitamin D it was still low he said he really only took the supplement for about a month and so has not been on it for a while.  Plan to recheck level again today.  Meds ordered this encounter  Medications   Cholecalciferol (VITAMIN D3) 25 MCG (1000 UT) capsule    Sig: Take 1 capsule (1,000 Units total) by mouth daily.    Dispense:  90 capsule    Refill:  0   DISCONTD: losartan (COZAAR) 25 MG tablet    Sig: Take 1 tablet (25 mg total) by mouth daily.     Dispense:  90 tablet    Refill:  1   rosuvastatin (CRESTOR) 5 MG tablet    Sig: Take 1 tablet (5 mg total) by mouth daily.    Dispense:  90 tablet    Refill:  3    LABS REQUIRED FOR REFILLS   losartan (COZAAR) 50 MG tablet    Sig: Take 1 tablet (50 mg total) by mouth daily.    Dispense:  90 tablet    Refill:  3    Return in about 6 weeks (around 03/04/2023) for nurse visit for BP check.  Can bring in home BPs as well.  Beatrice Lecher, MD

## 2023-01-21 NOTE — Assessment & Plan Note (Signed)
A1c due to be rechecked today it has been a most a year since it was last evaluated.  Plan to recheck again in 6 months if he is still at goal.

## 2023-01-22 LAB — BASIC METABOLIC PANEL WITH GFR
BUN: 10 mg/dL (ref 7–25)
CO2: 29 mmol/L (ref 20–32)
Calcium: 10 mg/dL (ref 8.6–10.3)
Chloride: 101 mmol/L (ref 98–110)
Creat: 1.07 mg/dL (ref 0.70–1.28)
Glucose, Bld: 128 mg/dL — ABNORMAL HIGH (ref 65–99)
Potassium: 4.2 mmol/L (ref 3.5–5.3)
Sodium: 140 mmol/L (ref 135–146)
eGFR: 72 mL/min/{1.73_m2} (ref >=60)

## 2023-01-22 LAB — HEMOGLOBIN A1C
Hgb A1c MFr Bld: 6.2 % of total Hgb — ABNORMAL HIGH (ref <5.7)
Mean Plasma Glucose: 131 mg/dL
eAG (mmol/L): 7.3 mmol/L

## 2023-01-22 LAB — VITAMIN D 25 HYDROXY (VIT D DEFICIENCY, FRACTURES): Vit D, 25-Hydroxy: 36 ng/mL (ref 30–100)

## 2023-01-22 NOTE — Progress Notes (Signed)
Hi [name],  Your A1c is stable at 6.2 so still in the prediabetes range but it is staying about the same over the last couple of years.  Need to work on healthy food choices and regular exercise.  Your metabolic panel looks great.  Your vitamin D is finally in the normal range.  But I did go ahead and refill the vitamin D so that you could continue to take it at least through the winter months and into the spring.

## 2023-01-27 DIAGNOSIS — K625 Hemorrhage of anus and rectum: Secondary | ICD-10-CM | POA: Diagnosis not present

## 2023-02-07 DIAGNOSIS — Z1211 Encounter for screening for malignant neoplasm of colon: Secondary | ICD-10-CM | POA: Diagnosis not present

## 2023-02-07 DIAGNOSIS — K648 Other hemorrhoids: Secondary | ICD-10-CM | POA: Diagnosis not present

## 2023-02-18 ENCOUNTER — Other Ambulatory Visit: Payer: Self-pay | Admitting: *Deleted

## 2023-02-18 DIAGNOSIS — I10 Essential (primary) hypertension: Secondary | ICD-10-CM

## 2023-02-18 MED ORDER — LOSARTAN POTASSIUM 50 MG PO TABS: 50.0000 mg | ORAL_TABLET | Freq: Every day | ORAL | 3 refills | Status: DC

## 2023-03-04 ENCOUNTER — Ambulatory Visit: Payer: Medicare HMO

## 2023-03-24 ENCOUNTER — Ambulatory Visit (INDEPENDENT_AMBULATORY_CARE_PROVIDER_SITE_OTHER): Payer: Medicare HMO | Admitting: Family Medicine

## 2023-03-24 VITALS — BP 152/75 | HR 65 | Ht 67.0 in | Wt 156.0 lb

## 2023-03-24 DIAGNOSIS — Z0000 Encounter for general adult medical examination without abnormal findings: Secondary | ICD-10-CM

## 2023-03-24 NOTE — Patient Instructions (Addendum)
MEDICARE ANNUAL WELLNESS VISIT Health Maintenance Summary and Written Plan of Care  [name] ,  Thank you for allowing me to perform your Medicare Annual Wellness Visit and for your ongoing commitment to your health.   Health Maintenance & Immunization History Health Maintenance  Topic Date Due   INFLUENZA VACCINE  07/10/2023   Medicare Annual Wellness (AWV)  03/23/2024   DTaP/Tdap/Td (3 - Td or Tdap) 09/20/2032   COLONOSCOPY (Pts 45-95yrs Insurance coverage will need to be confirmed)  02/06/2033   Pneumonia Vaccine 30+ Years old  Completed   COVID-19 Vaccine  Completed   Hepatitis C Screening  Completed   Zoster Vaccines- Shingrix  Completed   HPV VACCINES  Aged Out   Immunization History  Administered Date(s) Administered   COVID-19, mRNA, vaccine(Comirnaty)12 years and older 09/20/2022   Fluad Quad(high Dose 65+) 09/20/2022   Hepatitis A, Adult 09/09/2016   Influenza Whole 09/08/2005   Influenza, High Dose Seasonal PF 10/24/2018, 09/06/2019   Influenza,inj,Quad PF,6+ Mos 09/09/2016   Influenza-Unspecified 08/01/2015, 09/22/2017, 09/08/2020, 08/23/2021   Janssen (J&J) SARS-COV-2 Vaccination 02/20/2020, 10/06/2020   PFIZER SARS-COV-2 Pediatric Vaccination 5-48yrs 02/25/2021   PNEUMOCOCCAL CONJUGATE-20 02/25/2021   Pneumococcal Conjugate-13 01/27/2015   Pneumococcal Polysaccharide-23 02/15/2013   Tdap 02/03/2013, 09/20/2022   Typhoid Live 07/24/2015   Zoster Recombinat (Shingrix) 06/15/2020, 09/13/2020   Zoster, Live 08/01/2015    These are the patient goals that we discussed:  Goals Addressed               This Visit's Progress     Patient Stated (pt-stated)        Patient stated that he would like to stay healthy.         This is a list of Health Maintenance Items that are overdue or due now: There are no preventive care reminders to display for this patient.    Orders/Referrals Placed Today: No orders of the defined types were placed in this  encounter.  (Contact our referral department at [phone] if you have not spoken with someone about your referral appointment within the next 5 days)    Follow-up Plan Follow-up with Agapito Games, MD as planned Medicare wellness visit in one year.  AVS printed and given to patient.      Health Maintenance, Male Adopting a healthy lifestyle and getting preventive care are important in promoting health and wellness. Ask your health care provider about: The right schedule for you to have regular tests and exams. Things you can do on your own to prevent diseases and keep yourself healthy. What should I know about diet, weight, and exercise? Eat a healthy diet  Eat a diet that includes plenty of vegetables, fruits, low-fat dairy products, and lean protein. Do not eat a lot of foods that are high in solid fats, added sugars, or sodium. Maintain a healthy weight Body mass index (BMI) is a measurement that can be used to identify possible weight problems. It estimates body fat based on height and weight. Your health care provider can help determine your BMI and help you achieve or maintain a healthy weight. Get regular exercise Get regular exercise. This is one of the most important things you can do for your health. Most adults should: Exercise for at least 150 minutes each week. The exercise should increase your heart rate and make you sweat (moderate-intensity exercise). Do strengthening exercises at least twice a week. This is in addition to the moderate-intensity exercise. Spend less time sitting. Even light  physical activity can be beneficial. Watch cholesterol and blood lipids Have your blood tested for lipids and cholesterol at 76 years of age, then have this test every 5 years. You may need to have your cholesterol levels checked more often if: Your lipid or cholesterol levels are high. You are older than 76 years of age. You are at high risk for heart disease. What  should I know about cancer screening? Many types of cancers can be detected early and may often be prevented. Depending on your health history and family history, you may need to have cancer screening at various ages. This may include screening for: Colorectal cancer. Prostate cancer. Skin cancer. Lung cancer. What should I know about heart disease, diabetes, and high blood pressure? Blood pressure and heart disease High blood pressure causes heart disease and increases the risk of stroke. This is more likely to develop in people who have high blood pressure readings or are overweight. Talk with your health care provider about your target blood pressure readings. Have your blood pressure checked: Every 3-5 years if you are 15-68 years of age. Every year if you are 32 years old or older. If you are between the ages of 83 and 27 and are a current or former smoker, ask your health care provider if you should have a one-time screening for abdominal aortic aneurysm (AAA). Diabetes Have regular diabetes screenings. This checks your fasting blood sugar level. Have the screening done: Once every three years after age 51 if you are at a normal weight and have a low risk for diabetes. More often and at a younger age if you are overweight or have a high risk for diabetes. What should I know about preventing infection? Hepatitis B If you have a higher risk for hepatitis B, you should be screened for this virus. Talk with your health care provider to find out if you are at risk for hepatitis B infection. Hepatitis C Blood testing is recommended for: Everyone born from [birth year] through 1964. Anyone with known risk factors for hepatitis C. Sexually transmitted infections (STIs) You should be screened each year for STIs, including gonorrhea and chlamydia, if: You are sexually active and are younger than 76 years of age. You are older than 76 years of age and your health care provider tells you that you are  at risk for this type of infection. Your sexual activity has changed since you were last screened, and you are at increased risk for chlamydia or gonorrhea. Ask your health care provider if you are at risk. Ask your health care provider about whether you are at high risk for HIV. Your health care provider may recommend a prescription medicine to help prevent HIV infection. If you choose to take medicine to prevent HIV, you should first get tested for HIV. You should then be tested every 3 months for as long as you are taking the medicine. Follow these instructions at home: Alcohol use Do not drink alcohol if your health care provider tells you not to drink. If you drink alcohol: Limit how much you have to 0-2 drinks a day. Know how much alcohol is in your drink. In the [place] one drink equals one 12 oz bottle of beer (355 mL), one 5 oz glass of wine (148 mL), or one 1 oz glass of hard liquor (44 mL). Lifestyle Do not use any products that contain nicotine or tobacco. These products include cigarettes, chewing tobacco, and vaping devices, such as e-cigarettes. If you need  help quitting, ask your health care provider. Do not use street drugs. Do not share needles. Ask your health care provider for help if you need support or information about quitting drugs. General instructions Schedule regular health, dental, and eye exams. Stay current with your vaccines. Tell your health care provider if: You often feel depressed. You have ever been abused or do not feel safe at home. Summary Adopting a healthy lifestyle and getting preventive care are important in promoting health and wellness. Follow your health care provider's instructions about healthy diet, exercising, and getting tested or screened for diseases. Follow your health care provider's instructions on monitoring your cholesterol and blood pressure. This information is not intended to replace advice given to you by your health care provider.  Make sure you discuss any questions you have with your health care provider. Document Revised: 04/16/2021 Document Reviewed: 04/16/2021 Elsevier Patient Education  2023 [employer].

## 2023-03-24 NOTE — Progress Notes (Signed)
MEDICARE ANNUAL WELLNESS VISIT  03/24/2023  Subjective:  Angel Krueger is a 77 y.o. male patient of Metheney, Barbarann Ehlers, MD who had a Medicare Annual Wellness Visit today. Angel Krueger is Retired and lives with their spouse. he has 2 children. he reports that he is socially active and does interact with friends/family regularly. he is moderately physically active and enjoys gardening.  Patient Care Team: Agapito Games, MD as PCP - General (Family Medicine) Gabriel Carina, [facility] as Pharmacist (Pharmacist)     03/24/2023    9:01 AM 06/21/2019    9:03 AM  Advanced Directives  Does Patient Have a Medical Advance Directive? Yes No  Type of Advance Directive Living will   Does patient want to make changes to medical advance directive? No - Patient declined   Would patient like information on creating a medical advance directive?  No - Patient declined    Hospital Utilization Over the Past 12 Months: # of hospitalizations or ER visits: 0 # of surgeries: 0  Review of Systems    Patient reports that his overall health is unchanged when compared to last year.  Review of Systems: History obtained from chart review and the patient  All other systems negative.  Pain Assessment Pain : No/denies pain     Current Medications & Allergies (verified) Allergies as of 03/24/2023       Reactions   Penicillins         Medication List        Accurate as of March 24, 2023  9:26 AM. If you have any questions, ask your nurse or doctor.          brimonidine 0.2 % ophthalmic solution Commonly known as: ALPHAGAN Place 1 drop into both eyes 3 (three) times daily.   dorzolamide-timolol 2-0.5 % ophthalmic solution Commonly known as: COSOPT SMARTSIG:In Eye(s)   latanoprost 0.005 % ophthalmic solution Commonly known as: XALATAN Place 1 drop into both eyes at bedtime.   losartan 50 MG tablet Commonly known as: COZAAR Take 1 tablet (50 mg total) by mouth daily.   Procto-Med  HC 2.5 % rectal cream Generic drug: hydrocortisone Place rectally 2 (two) times daily.   rosuvastatin 5 MG tablet Commonly known as: CRESTOR Take 1 tablet (5 mg total) by mouth daily.   triamcinolone cream 0.1 % Commonly known as: KENALOG Apply 1 application topically daily as needed.   Vitamin D3 25 MCG (1000 UT) capsule Generic drug: Cholecalciferol Take 1 capsule (1,000 Units total) by mouth daily.        History (reviewed): Past Medical History:  Diagnosis Date   Generalized headaches    Past Surgical History:  Procedure Laterality Date   COLON SURGERY     Unknown specifics   Family History  Problem Relation Age of Onset   Glaucoma Mother    Social History   Socioeconomic History   Marital status: Married    Spouse name: Angel Krueger   Number of children: 2   Years of education: 13   Highest education level: Some college, no degree  Occupational History   Occupation: Statistician    Comment: retired  Tobacco Use   Smoking status: Never   Smokeless tobacco: Never  Vaping Use   Vaping Use: Never used  Substance and Sexual Activity   Alcohol use: Not Currently   Drug use: No   Sexual activity: Yes  Other Topics Concern   Not on file  Social History Narrative   Lives with spouse. He has  two children. He enjoys gardening and doing Nurse, mental health.   Social Determinants of Health   Financial Resource Strain: Low Risk  (03/24/2023)   Overall Financial Resource Strain (CARDIA)    Difficulty of Paying Living Expenses: Not hard at all  Food Insecurity: No Food Insecurity (03/24/2023)   Hunger Vital Sign    Worried About Running Out of Food in the Last Year: Never true    Ran Out of Food in the Last Year: Never true  Transportation Needs: No Transportation Needs (03/24/2023)   PRAPARE - Administrator, Civil Service (Medical): No    Lack of Transportation (Non-Medical): No  Physical Activity: Insufficiently Active (03/24/2023)   Exercise Vital Sign     Days of Exercise per Week: 3 days    Minutes of Exercise per Session: 30 min  Stress: No Stress Concern Present (03/24/2023)   [employer] of Occupational Health - Occupational Stress Questionnaire    Feeling of Stress : Not at all  Social Connections: Moderately Integrated (03/24/2023)   Social Connection and Isolation Panel [NHANES]    Frequency of Communication with Friends and Family: More than three times a week    Frequency of Social Gatherings with Friends and Family: Twice a week    Attends Religious Services: More than 4 times per year    Active Member of [employer] or Organizations: No    Attends Banker Meetings: Never    Marital Status: Married    Activities of Daily Living    03/24/2023    9:01 AM  In your present state of health, do you have any difficulty performing the following activities:  Hearing? 0  Vision? 0  Difficulty concentrating or making decisions? 0  Walking or climbing stairs? 0  Dressing or bathing? 0  Doing errands, shopping? 0  Preparing Food and eating ? N  Using the Toilet? N  In the past six months, have you accidently leaked urine? N  Do you have problems with loss of bowel control? N  Managing your Medications? N  Managing your Finances? N  Housekeeping or managing your Housekeeping? N    Patient Education/Literacy How often do you need to have someone help you when you read instructions, pamphlets, or other written materials from your doctor or pharmacy?: 1 - Never What is the last grade level you completed in school?: 1 year of college  Exercise Current Exercise Habits: Home exercise routine, Type of exercise: walking, Time (Minutes): 30, Frequency (Times/Week): 3, Weekly Exercise (Minutes/Week): 90, Intensity: Moderate, Exercise limited by: None identified  Diet Patient reports consuming  2-3  meals a day and 0-1 snack(s) a day Patient reports that his primary diet is: Regular Patient reports that she does have regular  access to food.   Depression Screen    03/24/2023    9:01 AM 01/21/2023    3:46 PM 04/24/2021    8:40 AM 06/21/2019    9:04 AM 12/17/2017   12:06 PM 12/17/2017    8:12 AM 02/29/2016    8:51 AM  PHQ 2/9 Scores  PHQ - 2 Score 0 0 0 1 0 0 0     Fall Risk    03/24/2023    9:01 AM 01/21/2023    3:45 PM 07/23/2021   10:21 AM 04/24/2021    8:40 AM 01/11/2020    8:54 AM  Fall Risk   Falls in the past year? 0 0 0 0 0  Number falls in past yr: 0 0  0 0 0  Injury with Fall? 0 0 0 0 0  Risk for fall due to : No Fall Risks No Fall Risks  No Fall Risks   Follow up Falls evaluation completed Falls evaluation completed  Falls prevention discussed;Falls evaluation completed Falls evaluation completed     Objective:   BP (!) 159/79 (BP Location: Left Arm, Patient Position: Sitting, Cuff Size: Normal)   Pulse 65   Ht  (1.702 m)   Wt 156 lb (70.8 kg)   SpO2 100%   BMI 24.43 kg/m   Last Weight  Most recent update: 03/24/2023  8:55 AM    Weight  70.8 kg (156 lb)             Body mass index is 24.43 kg/m.  Hearing/Vision  Chandler did not have difficulty with hearing/understanding during the face-to-face interview Aston did not have difficulty with his vision during the face-to-face interview Reports that he has had a formal eye exam by an eye care professional within the past year Reports that he has not had a formal hearing evaluation within the past year  Cognitive Function:    03/24/2023    9:04 AM 06/21/2019    9:04 AM  6CIT Screen  What Year? 0 points   What month? 0 points   What time? 0 points 0 points  Count back from 20 2 points 0 points  Months in reverse 0 points 2 points  Repeat phrase 4 points 0 points  Total Score 6 points     Normal Cognitive Function Screening: Yes (Normal:0-7, Significant for Dysfunction: >8)  Immunization & Health Maintenance Record Immunization History  Administered Date(s) Administered   COVID-19, mRNA, vaccine(Comirnaty)12 years and  older 09/20/2022   Fluad Quad(high Dose 65+) 09/20/2022   Hepatitis A, Adult 09/09/2016   Influenza Whole 09/08/2005   Influenza, High Dose Seasonal PF 10/24/2018, 09/06/2019   Influenza,inj,Quad PF,6+ Mos 09/09/2016   Influenza-Unspecified 08/01/2015, 09/22/2017, 09/08/2020, 08/23/2021   Janssen (J&J) SARS-COV-2 Vaccination 02/20/2020, 10/06/2020   PFIZER SARS-COV-2 Pediatric Vaccination 5-31yrs 02/25/2021   PNEUMOCOCCAL CONJUGATE-20 02/25/2021   Pneumococcal Conjugate-13 01/27/2015   Pneumococcal Polysaccharide-23 02/15/2013   Tdap 02/03/2013, 09/20/2022   Typhoid Live 07/24/2015   Zoster Recombinat (Shingrix) 06/15/2020, 09/13/2020   Zoster, Live 08/01/2015    Health Maintenance  Topic Date Due   INFLUENZA VACCINE  07/10/2023   Medicare Annual Wellness (AWV)  03/23/2024   DTaP/Tdap/Td (3 - Td or Tdap) 09/20/2032   COLONOSCOPY (Pts 45-65yrs Insurance coverage will need to be confirmed)  02/06/2033   Pneumonia Vaccine 60+ Years old  Completed   COVID-19 Vaccine  Completed   Hepatitis C Screening  Completed   Zoster Vaccines- Shingrix  Completed   HPV VACCINES  Aged Out       Assessment  This is a routine wellness examination for [employer]  Health Maintenance: Due or Overdue There are no preventive care reminders to display for this patient.   Artis Delay Figg does not need a referral for Community Assistance: Care Management:   no Social Work:    no Prescription Assistance:  no Nutrition/Diabetes Education:  no   Plan:  Personalized Goals  Goals Addressed               This Visit's Progress     Patient Stated (pt-stated)        Patient stated that he would like to stay healthy.       Personalized Health Maintenance & Screening Recommendations  There  are no preventive care reminders to display for this patient.  Lung Cancer Screening Recommended: no (Low Dose CT Chest recommended if Age 42-80 years, 30 pack-year currently smoking OR have quit w/in  past 15 years) Hepatitis C Screening recommended: no HIV Screening recommended: no  Advanced Directives: Written information was not given per the patient's request.  Referrals & Orders No orders of the defined types were placed in this encounter.   Follow-up Plan Follow-up with Agapito Games, MD as planned Medicare wellness visit in one year.  AVS printed and given to patient.   I have personally reviewed and noted the following in the patient's chart:   Medical and social history Use of alcohol, tobacco or illicit drugs  Current medications and supplements Functional ability and status Nutritional status Physical activity Advanced directives List of other physicians Hospitalizations, surgeries, and ER visits in previous 12 months Vitals Screenings to include cognitive, depression, and falls Referrals and appointments  In addition, I have reviewed and discussed with patient certain preventive protocols, quality metrics, and best practice recommendations. A written personalized care plan for preventive services as well as general preventive health recommendations were provided to patient.     Modesto Charon, RN BSN  03/24/2023

## 2023-03-25 ENCOUNTER — Other Ambulatory Visit: Payer: Self-pay | Admitting: Pharmacist

## 2023-03-25 NOTE — Progress Notes (Signed)
03/25/2023 Name: Angel Krueger MRN: [id number] DOB: 1946/05/20   Angel Krueger is a 77 y.o. year old male who presented for a telephone visit.   They were referred to the pharmacist by a quality report for assistance in managing  controlling blood pressure (CBP) .   Subjective:  Care Team: Primary Care Provider: Agapito Games, MD ; Next Scheduled Visit: 08/26/23   Medication Access/Adherence  Current Pharmacy:  [facility] [address] [place] - [address] [place] [address] Phone: [phone] Fax: [phone]  [facility] - [place], [place] - [address] [place] [address] Phone: [phone] Fax: [phone]   Patient reports affordability concerns with their medications: No  Patient reports access/transportation concerns to their pharmacy: No  Patient reports adherence concerns with their medications:  No  though it was identified he was still taking  daily, as patient did not realize [facility] filled for both  and .   Hypertension:  Current medications: losartan  daily (did not increase to  due to dispensing error)  Patient has a validated, automated, upper arm home BP cuff Current blood pressure readings readings: 145-150  systolic  Patient denies hypotensive s/sx including dizziness, lightheadedness.  Patient denies hypertensive symptoms including headache, chest pain, shortness of breath   Objective:  Lab Results  Component Value Date   HGBA1C 6.2 (H) 01/21/2023    Lab Results  Component Value Date   CREATININE 1.07 01/21/2023   BUN 10 01/21/2023   NA 140 01/21/2023   K 4.2 01/21/2023   CL 101 01/21/2023   CO2 29 01/21/2023    Lab Results  Component Value Date   CHOL 206 (H) 04/24/2021   HDL 45 04/24/2021   LDLCALC 140 (H) 04/24/2021   TRIG 100 04/24/2021   CHOLHDL 4.6 04/24/2021    Medications Reviewed  Today     Reviewed by Gabriel Carina, RPH (Pharmacist) on 03/25/23 at [phone]  Med List Status: <None>   Medication Order Taking? Sig Documenting Provider Last Dose Status Informant  brimonidine (ALPHAGAN) 0.2 % ophthalmic solution [id number]  Place 1 drop into both eyes 3 (three) times daily. [provider]  Active   Cholecalciferol (VITAMIN D3) 25 MCG (1000 UT) capsule [id number]  Take 1 capsule (1,000 Units total) by mouth daily. Agapito Games, MD  Active   dorzolamide-timolol (COSOPT) 2-0.5 % ophthalmic solution [id number]  SMARTSIG:In Eye(s) [provider]  Active   latanoprost (XALATAN) 0.005 % ophthalmic solution [id number]  Place 1 drop into both eyes at bedtime. Billee Cashing, MD  Active   losartan (COZAAR) 50 MG tablet [id number] No Take 1 tablet (50 mg total) by mouth daily.  Patient not taking: Reported on 03/25/2023   Agapito Games, MD Not Taking Active            Med Note Karsten Fells Mar 25, 2023  9:03 AM) [facility] both  and  bottles 01/2023, pt did not realize and has been taking , discovered upon 03/25/23 review.   PROCTO-MED HC 2.5 % rectal cream [id number]  Place rectally 2 (two) times daily. [provider]  Active   rosuvastatin (CRESTOR) 5 MG tablet [id number]  Take 1 tablet (5 mg total) by mouth daily. Agapito Games, MD  Active   triamcinolone cream (KENALOG) 0.1 % [id number]  Apply 1 application topically daily as needed. Agapito Games, MD  Active               Assessment/Plan:   Hypertension: - Currently uncontrolled, but discovered dispensing error - pharmacy filled both 25mg  and  at 01/2023 refill. Patient did not realize, and has been taking  daily.Contacted [facility] to discontinue future fills of .  - Provided counseling for patient on taking TWO of the  tablets to use them up, then  daily th nitoring technique and reviewed goal blood pressure. Recommended to check home blood pressure and heart rate 2x per week - Recommend to implement increase to losartan  daily.     Lynnda Shields, PharmD, BCPS Clinical Pharmacist [facility]

## 2023-04-07 ENCOUNTER — Ambulatory Visit (INDEPENDENT_AMBULATORY_CARE_PROVIDER_SITE_OTHER): Payer: Medicare HMO | Admitting: Family Medicine

## 2023-04-07 VITALS — BP 128/69 | HR 68 | Ht 67.0 in

## 2023-04-07 DIAGNOSIS — I10 Essential (primary) hypertension: Secondary | ICD-10-CM | POA: Diagnosis not present

## 2023-04-07 NOTE — Patient Instructions (Signed)
continue current medication regimen and keep upcoming appt schld for  08/26/2023

## 2023-04-07 NOTE — Progress Notes (Signed)
BP looks awesome!!!

## 2023-04-07 NOTE — Progress Notes (Signed)
   Established Patient Office Visit  Subjective   Patient ID: Angel Krueger, male    DOB: [birth year]  Age: 77 y.o. MRN: [id number]  Chief Complaint  Patient presents with   Hypertension    BP check - nurse visit.     HPI Hypertension- BP check nurse visit.  Patient denies chest pain, shortness of breath, dizziness or  problems with medication.   ROS    Objective:     BP 128/69   Pulse 68   Ht 5\' 7"  (1.702 m)   SpO2 100%   BMI 24.43 kg/m    Physical Exam   No results found for any visits on 04/07/23.    The 10-year ASCVD risk score (Arnett DK, et al., 2019) is: 29.5%    Assessment & Plan:  BP check - nurse visit.  Initial reading = 135/66. Second reading =128/69. Per Dr. Linford Arnold continue current medication regimen and keep upcoming appt schld for  08/26/2023 Problem List Items Addressed This Visit       Cardiovascular and Mediastinum   HTN (hypertension) - Primary    Return for continue current medication regimen and keep upcoming appt schld for  08/26/2023.    Elizabeth Palau, LPN

## 2023-04-18 ENCOUNTER — Ambulatory Visit (INDEPENDENT_AMBULATORY_CARE_PROVIDER_SITE_OTHER): Payer: Medicare HMO

## 2023-04-18 ENCOUNTER — Ambulatory Visit (INDEPENDENT_AMBULATORY_CARE_PROVIDER_SITE_OTHER): Payer: Medicare HMO | Admitting: Family Medicine

## 2023-04-18 ENCOUNTER — Encounter: Payer: Self-pay | Admitting: Family Medicine

## 2023-04-18 VITALS — BP 137/72 | HR 64 | Ht 67.0 in | Wt 155.0 lb

## 2023-04-18 DIAGNOSIS — R1084 Generalized abdominal pain: Secondary | ICD-10-CM | POA: Diagnosis not present

## 2023-04-18 DIAGNOSIS — K625 Hemorrhage of anus and rectum: Secondary | ICD-10-CM | POA: Diagnosis not present

## 2023-04-18 DIAGNOSIS — K649 Unspecified hemorrhoids: Secondary | ICD-10-CM | POA: Diagnosis not present

## 2023-04-18 DIAGNOSIS — E559 Vitamin D deficiency, unspecified: Secondary | ICD-10-CM | POA: Diagnosis not present

## 2023-04-18 DIAGNOSIS — R109 Unspecified abdominal pain: Secondary | ICD-10-CM | POA: Diagnosis not present

## 2023-04-18 MED ORDER — TRIAMCINOLONE ACETONIDE 0.5 % EX OINT: 1.0000 | TOPICAL_OINTMENT | Freq: Two times a day (BID) | CUTANEOUS | 0 refills | Status: DC | PRN

## 2023-04-18 MED ORDER — LIDOCAINE (ANORECTAL) 5 % EX CREA: 1.0000 | TOPICAL_CREAM | Freq: Every day | CUTANEOUS | 99 refills | Status: DC

## 2023-04-18 NOTE — Progress Notes (Signed)
Acute Office Visit  Subjective:     Patient ID: Angel Krueger, male    DOB: November 17, 1946, 77 y.o.   MRN: [id number]  Chief Complaint  Patient presents with   burning in rectum    HPI Patient is in today for burning in rectum.  He says been coming and going for months it will feel a little burning and irritated for couple days and it might feel better for couple of days.  But then it is back.  He says his bowels are moving normally he is not straining he denies any constipation.  Occasionally sees a little pink drips he says.  No abdominal pain but he does have a history of gastric ulcers that required surgery in his 30s.  His incision on the abdomen's quite large so I suspect he may have actually had a partial gastrectomy.  No nausea vomiting or.  He did try some sitz bath's and has been using some Procto-Med cream.  He says it helped some but it was quite expensive but he just feels like he keeps trying to treat the area.  He says his biggest concern is that there is something else going on in his abdomen that they could be missing.  ROS      Objective:    BP 137/72   Pulse 64   Ht 5\' 7"  (1.702 m)   Wt 155 lb (70.3 kg)   SpO2 99%   BMI 24.28 kg/m    Physical Exam  No results found for any visits on 04/18/23.      Assessment & Plan:   Problem List Items Addressed This Visit   None Visit Diagnoses     Rectal bleeding    -  Primary   Relevant Medications   Lidocaine, Anorectal, 5 % CREA   triamcinolone ointment (KENALOG) 0.5 %   Other Relevant Orders   DG Abd 1 View   Ambulatory referral to General Surgery   Hemorrhoids, unspecified hemorrhoid type       Relevant Medications   Lidocaine, Anorectal, 5 % CREA   triamcinolone ointment (KENALOG) 0.5 %   Other Relevant Orders   Ambulatory referral to General Surgery   Vitamin D deficiency       Relevant Orders   VITAMIN D 25 Hydroxy (Vit-D Deficiency, Fractures)      I think at this point he is having recurring  flares with his hemorrhoids and rectal bleeding some can go ahead and refer him to a general surgeon to discuss more definitive treatment in the meantime and going to write for cheaper steroid cream and also the lidocaine numbing cream that he can use and he can mix them together.  He is also very worried about something else going on in his abdomen but I did try to reassure him that I really think that this is coming from the hemorrhoids but we will at least get a KUB today.  But I do not think I have enough information to warrant a CT of the abdomen at this point.  Meds ordered this encounter  Medications   Lidocaine, Anorectal, 5 % CREA    Sig: Apply 1 Application topically daily.    Dispense:  28 g    Refill:  PRN   triamcinolone ointment (KENALOG) 0.5 %    Sig: Apply 1 Application topically 2 (two) times daily as needed.    Dispense:  30 g    Refill:  0    No follow-ups on file.  Beatrice Lecher, MD

## 2023-04-19 LAB — VITAMIN D 25 HYDROXY (VIT D DEFICIENCY, FRACTURES): Vit D, 25-Hydroxy: 29 ng/mL — ABNORMAL LOW (ref 30–100)

## 2023-04-21 NOTE — Progress Notes (Signed)
HI Babu, vitamin D is low it looks better 3 months ago.  So continue your supplement.

## 2023-04-24 NOTE — Progress Notes (Signed)
Please call patient and let him know that the x-ray of the abdomen was normal.  Normal gas bowel pattern.  No concerning findings.

## 2023-04-25 ENCOUNTER — Other Ambulatory Visit: Payer: Self-pay | Admitting: *Deleted

## 2023-04-25 DIAGNOSIS — K625 Hemorrhage of anus and rectum: Secondary | ICD-10-CM

## 2023-04-25 DIAGNOSIS — K649 Unspecified hemorrhoids: Secondary | ICD-10-CM

## 2023-04-25 DIAGNOSIS — E559 Vitamin D deficiency, unspecified: Secondary | ICD-10-CM

## 2023-04-25 MED ORDER — VITAMIN D3 25 MCG (1000 UT) PO CAPS: 1000.0000 [IU] | ORAL_CAPSULE | Freq: Every day | ORAL | 3 refills | Status: DC

## 2023-04-25 MED ORDER — TRIAMCINOLONE ACETONIDE 0.5 % EX OINT
1.0000 | TOPICAL_OINTMENT | Freq: Two times a day (BID) | CUTANEOUS | 0 refills | Status: DC | PRN
Start: 2023-04-25 — End: 2023-06-16

## 2023-05-01 ENCOUNTER — Telehealth: Payer: Self-pay

## 2023-05-01 DIAGNOSIS — Z7184 Encounter for health counseling related to travel: Secondary | ICD-10-CM

## 2023-05-01 NOTE — Telephone Encounter (Signed)
Patient wife asking that Dr. Linford Arnold also give Travel advice for her husband. She was informed today of her recommendations.

## 2023-05-02 MED ORDER — DOXYCYCLINE HYCLATE 100 MG PO TABS
100.0000 mg | ORAL_TABLET | Freq: Every day | ORAL | 0 refills | Status: DC
Start: 2023-05-02 — End: 2023-08-26

## 2023-05-02 MED ORDER — AZITHROMYCIN 250 MG PO TABS
ORAL_TABLET | ORAL | 0 refills | Status: AC
Start: 2023-05-02 — End: 2023-05-07

## 2023-05-02 NOTE — Telephone Encounter (Signed)
Please call pt        Travel advice-will need to make sure that he has first Twinrix (hepatitis A/B) which I do not see documented on the chart.  Also need to make sure that he has had yellow fever vaccination which she could be able to get through the local pharmacy or health department.  And she will need malaria prophylaxis for [place]..     Will send in prescription for malaria prophylaxis as well as a prescription to take for traveler's diarrhea just in case.

## 2023-05-02 NOTE — Telephone Encounter (Signed)
Attempted call to patient. Left voice mail message requesting a return call.  

## 2023-05-12 NOTE — Telephone Encounter (Signed)
LVM advising pt to return call to discuss travel advice.

## 2023-05-15 NOTE — Telephone Encounter (Signed)
Again atttempted call to patient. Again left voice mail message. Will place the message in a letter and mail to patient.

## 2023-05-15 NOTE — Telephone Encounter (Signed)
Letter placed in mail to patient.

## 2023-05-19 DIAGNOSIS — K6289 Other specified diseases of anus and rectum: Secondary | ICD-10-CM | POA: Diagnosis not present

## 2023-05-19 DIAGNOSIS — K641 Second degree hemorrhoids: Secondary | ICD-10-CM | POA: Diagnosis not present

## 2023-05-29 ENCOUNTER — Telehealth: Payer: Self-pay | Admitting: Family Medicine

## 2023-05-29 ENCOUNTER — Other Ambulatory Visit: Payer: Self-pay | Admitting: Family Medicine

## 2023-05-29 DIAGNOSIS — K649 Unspecified hemorrhoids: Secondary | ICD-10-CM

## 2023-05-29 DIAGNOSIS — K625 Hemorrhage of anus and rectum: Secondary | ICD-10-CM

## 2023-05-29 DIAGNOSIS — E785 Hyperlipidemia, unspecified: Secondary | ICD-10-CM

## 2023-05-29 NOTE — Telephone Encounter (Signed)
Patient is requesting to see a male provider for his hemmroids please advise

## 2023-06-02 ENCOUNTER — Encounter: Payer: Self-pay | Admitting: Sports Medicine

## 2023-06-02 ENCOUNTER — Ambulatory Visit (INDEPENDENT_AMBULATORY_CARE_PROVIDER_SITE_OTHER): Payer: Medicare HMO | Admitting: Sports Medicine

## 2023-06-02 VITALS — BP 147/81 | HR 76 | Ht 67.0 in

## 2023-06-02 DIAGNOSIS — K644 Residual hemorrhoidal skin tags: Secondary | ICD-10-CM

## 2023-06-02 DIAGNOSIS — Z7184 Encounter for health counseling related to travel: Secondary | ICD-10-CM

## 2023-06-02 MED ORDER — TYPHOID VACCINE PO CPDR
1.0000 | DELAYED_RELEASE_CAPSULE | ORAL | 0 refills | Status: DC
Start: 2023-06-02 — End: 2023-08-26

## 2023-06-02 MED ORDER — ATOVAQUONE-PROGUANIL HCL 250-100 MG PO TABS
1.0000 | ORAL_TABLET | Freq: Every day | ORAL | 0 refills | Status: DC
Start: 2023-06-02 — End: 2023-08-26

## 2023-06-02 MED ORDER — HYDROCORTISONE ACETATE 25 MG RE SUPP
25.0000 mg | Freq: Two times a day (BID) | RECTAL | 2 refills | Status: DC | PRN
Start: 2023-06-02 — End: 2023-06-03

## 2023-06-02 MED ORDER — WITCH HAZEL-GLYCERIN EX PADS
1.0000 | MEDICATED_PAD | CUTANEOUS | 11 refills | Status: DC | PRN
Start: 2023-06-02 — End: 2023-11-25

## 2023-06-02 MED ORDER — VAXCHORA PO SUSR
100.0000 mL | Freq: Once | ORAL | 0 refills | Status: AC
Start: 2023-06-02 — End: 2023-06-02

## 2023-06-02 NOTE — Assessment & Plan Note (Signed)
2 months of itchiness, he did have a fairly large external hemorrhoid at approximately the 10 o'clock position. No bleeding, no ulceration. He has been doing some over-the-counter creams including pramoxine and glycerin containing creams. His main complaint is itching, we will add Anusol HC suppositories, he will wipe with Tucks/witch hazel pads. His stool is soft already. He can return to see me in a couple of months regarding this. I did advise him this would likely improve on its own, he would still likely have redundant skin from the hemorrhoid, and that if his symptoms were controlled we should not consider any surgical intervention or removal of the redundant skin.

## 2023-06-02 NOTE — Progress Notes (Signed)
    Procedures performed today:    None.  Independent interpretation of notes and tests performed by another provider:   None.  Brief History, Exam, Impression, and Recommendations:    Travel advice encounter Patient is going to [place], we will add malaria, typhoid, cholera prophylaxis. He asked about his wife, I asked him to have her reach out to the PCP.  External hemorrhoid 2 months of itchiness, he did have a fairly large external hemorrhoid at approximately the 10 o'clock position. No bleeding, no ulceration. He has been doing some over-the-counter creams including pramoxine and glycerin containing creams. His main complaint is itching, we will add Anusol HC suppositories, he will wipe with Tucks/witch hazel pads. His stool is soft already. He can return to see me in a couple of months regarding this. I did advise him this would likely improve on its own, he would still likely have redundant skin from the hemorrhoid, and that if his symptoms were controlled we should not consider any surgical intervention or removal of the redundant skin.    ____________________________________________ Ihor Austin. Benjamin Stain, M.D., ABFM., CAQSM., AME. Primary Care and Sports Medicine [facility]  Adjunct Professor of Family Medicine  [employer] [employer]  Restaurant manager, fast food

## 2023-06-02 NOTE — Assessment & Plan Note (Signed)
Patient is going to [place], we will add malaria, typhoid, cholera prophylaxis. He asked about his wife, I asked him to have her reach out to the PCP.

## 2023-06-03 ENCOUNTER — Telehealth: Payer: Self-pay | Admitting: Sports Medicine

## 2023-06-03 DIAGNOSIS — K644 Residual hemorrhoidal skin tags: Secondary | ICD-10-CM

## 2023-06-03 MED ORDER — HYDROCORTISONE ACETATE 25 MG RE SUPP
25.0000 mg | Freq: Two times a day (BID) | RECTAL | 2 refills | Status: DC | PRN
Start: 2023-06-03 — End: 2024-03-18

## 2023-06-03 NOTE — Telephone Encounter (Signed)
Patient called he is requesting a alter medicine for hydrocortisone 25mg  supp is too expensive he is requesting something to substitute  please submit to [facility] [phone]

## 2023-06-03 NOTE — Telephone Encounter (Addendum)
He just needs to go to the good Rx website and select Anusol [facility] suppositories and let me know where he would like me to send it, here is the website.  https://www.goodrx.com/anusol-[email or web address]&dosage=25mg &quantity=24&slug=[email or web address]-hc  [facility], [facility] can [facility] if he would like.

## 2023-06-03 NOTE — Addendum Note (Signed)
Addended by: Monica Becton on: 06/03/2023 04:08 PM   Modules accepted: Orders

## 2023-06-03 NOTE — Telephone Encounter (Signed)
Sent!

## 2023-06-15 ENCOUNTER — Other Ambulatory Visit: Payer: Self-pay | Admitting: Family Medicine

## 2023-06-15 DIAGNOSIS — K649 Unspecified hemorrhoids: Secondary | ICD-10-CM

## 2023-06-15 DIAGNOSIS — K625 Hemorrhage of anus and rectum: Secondary | ICD-10-CM

## 2023-08-26 ENCOUNTER — Encounter: Payer: Self-pay | Admitting: Family Medicine

## 2023-08-26 ENCOUNTER — Ambulatory Visit (INDEPENDENT_AMBULATORY_CARE_PROVIDER_SITE_OTHER): Payer: Medicare HMO | Admitting: Family Medicine

## 2023-08-26 VITALS — BP 134/68 | HR 61 | Ht 67.0 in | Wt 158.0 lb

## 2023-08-26 DIAGNOSIS — Z23 Encounter for immunization: Secondary | ICD-10-CM | POA: Diagnosis not present

## 2023-08-26 DIAGNOSIS — Z0000 Encounter for general adult medical examination without abnormal findings: Secondary | ICD-10-CM

## 2023-08-26 DIAGNOSIS — K649 Unspecified hemorrhoids: Secondary | ICD-10-CM

## 2023-08-26 DIAGNOSIS — R7301 Impaired fasting glucose: Secondary | ICD-10-CM

## 2023-08-26 DIAGNOSIS — D696 Thrombocytopenia, unspecified: Secondary | ICD-10-CM | POA: Diagnosis not present

## 2023-08-26 DIAGNOSIS — I10 Essential (primary) hypertension: Secondary | ICD-10-CM | POA: Diagnosis not present

## 2023-08-26 NOTE — Assessment & Plan Note (Signed)
Check CBC 

## 2023-08-26 NOTE — Patient Instructions (Signed)
To keep your stools more soft you can take Colace (stool softener) twice a day with a meal. It may take 1-2 weeks to see a difference in your stools.   If that is not strong enough then can take 1/2 capful of miralax (powder) mixed with 6 oz of water nightly. AGain may take a week to see the effect.

## 2023-08-26 NOTE — Progress Notes (Signed)
Complete physical exam  Patient: Angel Krueger   DOB: [birth year]   77 y.o. Male  MRN: [id number]  Subjective:    Chief Complaint  Patient presents with   Annual Exam    Rayhaan Antonopoulos is a 77 y.o. male who presents today for a complete physical exam. He reports consuming a general diet.  Stay active.   He generally feels well.  He does not have additional problems to discuss today.    Most recent fall risk assessment:    03/24/2023    9:01 AM  Fall Risk   Falls in the past year? 0  Number falls in past yr: 0  Injury with Fall? 0  Risk for fall due to : No Fall Risks  Follow up Falls evaluation completed     Most recent depression screenings:    03/24/2023    9:01 AM 01/21/2023    3:46 PM  PHQ 2/9 Scores  PHQ - 2 Score 0 0        Patient Care Team: Agapito Games, MD as PCP - General (Family Medicine) Gabriel Carina, [facility] as Pharmacist (Pharmacist)   Outpatient Medications Prior to Visit  Medication Sig   brimonidine (ALPHAGAN) 0.2 % ophthalmic solution Place 1 drop into both eyes 3 (three) times daily.   Cholecalciferol (VITAMIN D3) 25 MCG (1000 UT) CAPS Take 1 capsule (1,000 Units total) by mouth daily.   dorzolamide-timolol (COSOPT) 2-0.5 % ophthalmic solution SMARTSIG:In Eye(s)   hydrocortisone (ANUSOL-HC) 25 MG suppository Place 1 suppository (25 mg total) rectally 2 (two) times daily as needed for hemorrhoids.   latanoprost (XALATAN) 0.005 % ophthalmic solution Place 1 drop into both eyes at bedtime.   Lidocaine, Anorectal, 5 % CREA Apply 1 Application topically daily.   losartan (COZAAR) 50 MG tablet Take 1 tablet (50 mg total) by mouth daily.   rosuvastatin (CRESTOR) 5 MG tablet Take 1 tablet (5 mg total) by mouth daily.   triamcinolone ointment (KENALOG) 0.5 % APPLY 1 APPLICATION TOPICALLY 2 (TWO) TIMES DAILY AS NEEDED.   witch hazel-glycerin (TUCKS) pad Apply 1 Application topically as needed.   [DISCONTINUED] atovaquone-proguanil (MALARONE)  250-100 MG TABS tablet Take 1 tablet by mouth daily. One tab PO daily starting 2 days before travel and finish 7 days after travel.   [DISCONTINUED] doxycycline (VIBRA-TABS) 100 MG tablet Take 1 tablet (100 mg total) by mouth daily. Start 2 days prior to trip to [place]   [DISCONTINUED] PROCTO-MED HC 2.5 % rectal cream Place rectally 2 (two) times daily.   [DISCONTINUED] triamcinolone cream (KENALOG) 0.1 % Apply 1 application topically daily as needed.   [DISCONTINUED] typhoid (VIVOTIF) DR capsule Take 1 capsule by mouth every other day.   No facility-administered medications prior to visit.    ROS        Objective:     BP 134/68   Pulse 61   Ht 5\' 7"  (1.702 m)   Wt 158 lb (71.7 kg)   SpO2 99%   BMI 24.75 kg/m    Physical Exam Constitutional:      Appearance: Normal appearance.  HENT:     Head: Normocephalic and atraumatic.     Right Ear: Tympanic membrane, ear canal and external ear normal.     Left Ear: Tympanic membrane, ear canal and external ear normal.     Nose: Nose normal.     Mouth/Throat:     Pharynx: Oropharynx is clear.  Eyes:     Extraocular Movements: Extraocular movements intact.  Conjunctiva/sclera: Conjunctivae normal.     Pupils: Pupils are equal, round, and reactive to light.  Neck:     Thyroid: No thyromegaly.     Vascular: No carotid bruit.  Cardiovascular:     Rate and Rhythm: Normal rate and regular rhythm.  Pulmonary:     Effort: Pulmonary effort is normal.     Breath sounds: Normal breath sounds.  Abdominal:     General: Bowel sounds are normal.     Palpations: Abdomen is soft.     Tenderness: There is no abdominal tenderness.  Musculoskeletal:        General: No swelling.     Cervical back: Neck supple.  Skin:    General: Skin is warm and dry.  Neurological:     Mental Status: He is oriented to person, place, and time.  Psychiatric:        Mood and Affect: Mood normal.        Behavior: Behavior normal.      No results found  for any visits on 08/26/23.     Assessment & Plan:    Routine Health Maintenance and Physical Exam  Immunization History  Administered Date(s) Administered   COVID-19, mRNA, vaccine(Comirnaty)12 years and older 09/20/2022   Fluad Quad(high Dose 65+) 09/20/2022   Fluad Trivalent(High Dose 65+) 08/26/2023   Hepatitis A, Adult 09/09/2016   Influenza Whole 09/08/2005   Influenza, High Dose Seasonal PF 10/24/2018, 09/06/2019   Influenza,inj,Quad PF,6+ Mos 09/09/2016   Influenza-Unspecified 08/01/2015, 09/22/2017, 09/08/2020, 08/23/2021   Janssen (J&J) SARS-COV-2 Vaccination 02/20/2020, 10/06/2020   PFIZER SARS-COV-2 Pediatric Vaccination 5-50yrs 02/25/2021   PNEUMOCOCCAL CONJUGATE-20 02/25/2021   Pneumococcal Conjugate-13 01/27/2015   Pneumococcal Polysaccharide-23 02/15/2013   Tdap 02/03/2013, 09/20/2022   Typhoid Live 07/24/2015   Zoster Recombinant(Shingrix) 06/15/2020, 09/13/2020   Zoster, Live 08/01/2015    Health Maintenance  Topic Date Due   COVID-19 Vaccine (5 - 2023-24 season) 08/10/2023   Medicare Annual Wellness (AWV)  03/23/2024   DTaP/Tdap/Td (3 - Td or Tdap) 09/20/2032   Colonoscopy  02/06/2033   Pneumonia Vaccine 83+ Years old  Completed   INFLUENZA VACCINE  Completed   Hepatitis C Screening  Completed   Zoster Vaccines- Shingrix  Completed   HPV VACCINES  Aged Out    Discussed health benefits of physical activity, and encouraged him to engage in regular exercise appropriate for his age and condition.  Problem List Items Addressed This Visit       Cardiovascular and Mediastinum   HTN (hypertension)   Relevant Orders   CMP14+EGFR   Lipid panel   CBC   Hemoglobin A1c   CT CARDIAC SCORING (SELF PAY ONLY)     Endocrine   IFG (impaired fasting glucose)   Relevant Orders   CMP14+EGFR   Lipid panel   CBC   Hemoglobin A1c   CT CARDIAC SCORING (SELF PAY ONLY)     Hematopoietic and Hemostatic   Thrombocytopenia (HCC)    Check CBC      Other Visit  Diagnoses     Wellness examination    -  Primary   Encounter for immunization       Relevant Orders   Flu Vaccine Trivalent High Dose (Fluad) (Completed)      Return in about 6 months (around 02/23/2024) for Hypertension. Keep up a regular exercise program and make sure you are eating a healthy diet Try to eat 4 servings of dairy a day, or if you are lactose intolerant take a calcium  with vitamin D daily.  Your vaccines are up to date.   Hemorrhoids - To keep your stools more soft you can take Colace (stool softener) twice a day with a meal. It may take 1-2 weeks to see a difference in your stools.   If that is not strong enough then can take 1/2 capful of miralax (powder) mixed with 6 oz of water nightly. AGain may take a week to see the effect.      Nani Gasser, MD

## 2023-08-27 ENCOUNTER — Other Ambulatory Visit: Payer: Medicare HMO

## 2023-08-27 NOTE — Addendum Note (Signed)
Addended by: Nani Gasser D on: 08/27/2023 07:55 AM   Modules accepted: Orders

## 2023-08-27 NOTE — Progress Notes (Signed)
Hi Angel Krueger, your metabolic panel looks great.  Your hemoglobin has really dropped normally you are 16 or 17 and its down to 11.  Been having some issues with your hemorrhoids but that is a pretty significant drop.  I think you are bleeding a lot more from your hemorrhoids then you may be realizing.  So lets go ahead and try to move forward with a consult with a rectal surgeon for treatment.  Also your A1c is in the diabetes range it was 6.2 and now 6.7.  This is in the full diabetes category not just prediabetes.  So I really want to have you work on cutting back on foods that are sugary or sweet or sweetened beverages as well as carbohydrates including bread rice and Pasta and then lets plan to recheck your A1c again in 3 months.  [name] all looks good.  Please have him schedule follow-up in 3 months. Please call lab and have them add an iron panel.

## 2023-08-28 ENCOUNTER — Telehealth: Payer: Self-pay | Admitting: Family Medicine

## 2023-08-28 NOTE — Telephone Encounter (Signed)
Pt returned your call re: Test results

## 2023-08-28 NOTE — Progress Notes (Signed)
HI Babu, we did have the lab add an iron panel.  And your iron is low.  Please start over the counter Iron and then I want to recheck your iron in about 8 weeks.

## 2023-08-29 LAB — IRON AND TIBC
Iron Saturation: 4 % — CL (ref 15–55)
Iron: 23 ug/dL — ABNORMAL LOW (ref 38–169)
Total Iron Binding Capacity: 525 ug/dL — ABNORMAL HIGH (ref 250–450)
UIBC: 502 ug/dL — ABNORMAL HIGH (ref 111–343)

## 2023-08-29 LAB — SPECIMEN STATUS REPORT

## 2023-08-29 NOTE — Telephone Encounter (Signed)
Form completed to add aditionalcodes for the labs added onto labs.

## 2023-09-11 DIAGNOSIS — L290 Pruritus ani: Secondary | ICD-10-CM | POA: Diagnosis not present

## 2023-09-11 DIAGNOSIS — K648 Other hemorrhoids: Secondary | ICD-10-CM | POA: Diagnosis not present

## 2023-09-26 DIAGNOSIS — K648 Other hemorrhoids: Secondary | ICD-10-CM | POA: Diagnosis not present

## 2023-09-26 DIAGNOSIS — Z1330 Encounter for screening examination for mental health and behavioral disorders, unspecified: Secondary | ICD-10-CM | POA: Diagnosis not present

## 2023-10-07 DIAGNOSIS — H4052X2 Glaucoma secondary to other eye disorders, left eye, moderate stage: Secondary | ICD-10-CM | POA: Diagnosis not present

## 2023-10-07 DIAGNOSIS — Z961 Presence of intraocular lens: Secondary | ICD-10-CM | POA: Diagnosis not present

## 2023-10-07 DIAGNOSIS — H4051X3 Glaucoma secondary to other eye disorders, right eye, severe stage: Secondary | ICD-10-CM | POA: Diagnosis not present

## 2023-10-09 DIAGNOSIS — L290 Pruritus ani: Secondary | ICD-10-CM | POA: Diagnosis not present

## 2023-10-09 DIAGNOSIS — K648 Other hemorrhoids: Secondary | ICD-10-CM | POA: Diagnosis not present

## 2023-10-13 ENCOUNTER — Encounter: Payer: Self-pay | Admitting: Family Medicine

## 2023-10-13 DIAGNOSIS — I10 Essential (primary) hypertension: Secondary | ICD-10-CM

## 2023-10-14 MED ORDER — PROPRANOLOL HCL 20 MG PO TABS: 20.0000 mg | ORAL_TABLET | Freq: Two times a day (BID) | ORAL | 1 refills | Status: DC

## 2023-10-31 DIAGNOSIS — K648 Other hemorrhoids: Secondary | ICD-10-CM | POA: Diagnosis not present

## 2023-11-10 DIAGNOSIS — H524 Presbyopia: Secondary | ICD-10-CM | POA: Diagnosis not present

## 2023-11-25 ENCOUNTER — Ambulatory Visit (INDEPENDENT_AMBULATORY_CARE_PROVIDER_SITE_OTHER): Payer: Medicare HMO | Admitting: Family Medicine

## 2023-11-25 ENCOUNTER — Encounter: Payer: Self-pay | Admitting: Family Medicine

## 2023-11-25 VITALS — BP 122/68 | HR 47 | Ht 67.0 in | Wt 159.0 lb

## 2023-11-25 DIAGNOSIS — E559 Vitamin D deficiency, unspecified: Secondary | ICD-10-CM | POA: Diagnosis not present

## 2023-11-25 DIAGNOSIS — G8929 Other chronic pain: Secondary | ICD-10-CM

## 2023-11-25 DIAGNOSIS — R7301 Impaired fasting glucose: Secondary | ICD-10-CM

## 2023-11-25 DIAGNOSIS — I10 Essential (primary) hypertension: Secondary | ICD-10-CM

## 2023-11-25 DIAGNOSIS — E118 Type 2 diabetes mellitus with unspecified complications: Secondary | ICD-10-CM

## 2023-11-25 DIAGNOSIS — R519 Headache, unspecified: Secondary | ICD-10-CM | POA: Insufficient documentation

## 2023-11-25 DIAGNOSIS — R790 Abnormal level of blood mineral: Secondary | ICD-10-CM

## 2023-11-25 LAB — POCT GLYCOSYLATED HEMOGLOBIN (HGB A1C): Hemoglobin A1C: 6.3 % — AB (ref 4.0–5.6)

## 2023-11-25 MED ORDER — TRAMADOL HCL 50 MG PO TABS: 50.0000 mg | ORAL_TABLET | Freq: Three times a day (TID) | ORAL | 0 refills | Status: AC | PRN

## 2023-11-25 MED ORDER — FLUOCINONIDE 0.05 % EX SOLN: 1.0000 | Freq: Two times a day (BID) | CUTANEOUS | 1 refills | Status: DC | PRN

## 2023-11-25 NOTE — Assessment & Plan Note (Signed)
Reports home BPs running in the  140s as well.  I rec increasing his losartan.  He wants to work on low sodium diet before increasing hsi losartan.  F/U in April when gets back from trip

## 2023-11-25 NOTE — Assessment & Plan Note (Addendum)
Lab Results  Component Value Date   HGBA1C 6.3 (A) 11/25/2023   Much improved.  He is really done a great job in making some dietary improvements and walking 2 to 3 miles a day to improve his A1c he still not currently on medication.  Continue Crestor and losartan.

## 2023-11-25 NOTE — Progress Notes (Signed)
Established Patient Office Visit  Subjective  Patient ID: Angel Krueger, male    DOB: 12-Mar-1946  Age: 77 y.o. MRN: [id number]  Chief Complaint  Patient presents with   Diabetes    HPI  Diabetes - no hypoglycemic events. No wounds or sores that are not healing well. No increased thirst or urination. Checking glucose at home. Taking medications as prescribed without any side effects.  Also brought in old bottle of fluocinolone ext soln he would like refilled. Uses it on bumps he gets on his scalp.    He would also like a short supply of tramadol to use for his headaches when ASA and Advil don't work.    Hypertension- Pt denies chest pain, SOB, dizziness, or heart palpitations.  Taking meds as directed w/o problems.  Denies medication side effects.       ROS    Objective:     BP 122/68   Pulse (!) 47   Ht 5\' 7"  (1.702 m)   Wt 159 lb (72.1 kg)   SpO2 99%   BMI 24.90 kg/m    Physical Exam Vitals and nursing note reviewed.  Constitutional:      Appearance: Normal appearance.  HENT:     Head: Normocephalic and atraumatic.  Eyes:     Conjunctiva/sclera: Conjunctivae normal.  Cardiovascular:     Rate and Rhythm: Normal rate and regular rhythm.  Pulmonary:     Effort: Pulmonary effort is normal.     Breath sounds: Normal breath sounds.  Skin:    General: Skin is warm and dry.  Neurological:     Mental Status: He is alert.  Psychiatric:        Mood and Affect: Mood normal.      Results for orders placed or performed in visit on 11/25/23  POCT HgB A1C  Result Value Ref Range   Hemoglobin A1C 6.3 (A) 4.0 - 5.6 %   HbA1c POC (<> result, manual entry)     HbA1c, POC (prediabetic range)     HbA1c, POC (controlled diabetic range)        The ASCVD Risk score (Arnett DK, et al., 2019) failed to calculate for the following reasons:   The valid total cholesterol range is 130 to 320 mg/dL    Assessment & Plan:   Problem List Items Addressed This Visit        Cardiovascular and Mediastinum   HTN (hypertension)   Reports home BPs running in the  140s as well.  I rec increasing his losartan.  He wants to work on low sodium diet before increasing hsi losartan.  F/U in April when gets back from trip        Endocrine   Controlled diabetes mellitus type 2 with complications [facility]) - Primary   Lab Results  Component Value Date   HGBA1C 6.3 (A) 11/25/2023   Much improved.  He is really done a great job in making some dietary improvements and walking 2 to 3 miles a day to improve his A1c he still not currently on medication.  Continue Crestor and losartan.        Other   Chronic headaches   Sent in short supply of tramadol. Mostly uses an NSAID      Relevant Medications   traMADol (ULTRAM) 50 MG tablet   Other Visit Diagnoses       Vitamin D deficiency       Relevant Orders   Fe+TIBC+Fer   VITAMIN D 25 Hydroxy (  Vit-D Deficiency, Fractures)     Low ferritin       Relevant Orders   Fe+TIBC+Fer      He is currently taking an iron and vitamin D supplement for previous deficiency.  Will recheck levels today to make sure that he is absorbing those adequately.   Return in about 4 months (around 03/25/2024) for Diabetes follow-up.    Nani Gasser, MD

## 2023-11-25 NOTE — Assessment & Plan Note (Signed)
Sent in short supply of tramadol. Mostly uses an NSAID

## 2023-11-26 LAB — IRON,TIBC AND FERRITIN PANEL
Ferritin: 35 ng/mL (ref 30–400)
Iron Saturation: 35 % (ref 15–55)
Iron: 144 ug/dL (ref 38–169)
Total Iron Binding Capacity: 407 ug/dL (ref 250–450)
UIBC: 263 ug/dL (ref 111–343)

## 2023-11-26 LAB — VITAMIN D 25 HYDROXY (VIT D DEFICIENCY, FRACTURES): Vit D, 25-Hydroxy: 28.6 ng/mL — ABNORMAL LOW (ref 30.0–100.0)

## 2023-11-26 NOTE — Progress Notes (Signed)
Hi Angel Krueger, vitamin D is still low.  How much are you taking?  May need to put you on a prescription version.  Iron level looks much better.  Coming up very nicely still like to get your ferritin above 40 so please continue taking the iron for another 3 to 4 months and then we can probably stop at that point.  I apologize we forgot to do the urine test to check for protein yearly to make sure that there is no sign that your kidneys are experiencing any stress.  Is there any possible way you would be able to come back either later this week or next week to do the urine sample for urine microalbumin?

## 2023-11-27 ENCOUNTER — Other Ambulatory Visit: Payer: Self-pay | Admitting: *Deleted

## 2023-11-27 ENCOUNTER — Ambulatory Visit: Payer: Medicare HMO | Admitting: Family Medicine

## 2023-11-27 DIAGNOSIS — E118 Type 2 diabetes mellitus with unspecified complications: Secondary | ICD-10-CM | POA: Diagnosis not present

## 2023-11-27 NOTE — Progress Notes (Signed)
Please increase vitamin D to 2000 IU daily.  So he can take 2 of his current tabs.  They also sell a 2000 IU if he wants to pick that up instead.

## 2023-11-28 ENCOUNTER — Telehealth: Payer: Self-pay

## 2023-11-28 LAB — MICROALBUMIN / CREATININE URINE RATIO
Creatinine, Urine: 148.4 mg/dL
Microalb/Creat Ratio: 26 mg/g{creat} (ref 0–29)
Microalbumin, Urine: 38.6 ug/mL

## 2023-11-28 NOTE — Telephone Encounter (Signed)
Copied from CRM [id number]. Topic: General - Call Back - No Documentation >> Nov 28, 2023  9:19 AM Gildardo Pounds wrote: Reason for CRM: Patient returning a missed call. No message showing.

## 2023-11-28 NOTE — Telephone Encounter (Signed)
Patient has viewed results and information on Mychart message .

## 2024-01-28 ENCOUNTER — Other Ambulatory Visit: Payer: Self-pay | Admitting: Family Medicine

## 2024-01-28 DIAGNOSIS — I10 Essential (primary) hypertension: Secondary | ICD-10-CM

## 2024-01-28 DIAGNOSIS — E785 Hyperlipidemia, unspecified: Secondary | ICD-10-CM

## 2024-02-23 ENCOUNTER — Ambulatory Visit: Payer: Medicare HMO | Admitting: Family Medicine

## 2024-03-18 ENCOUNTER — Encounter: Payer: Self-pay | Admitting: Family Medicine

## 2024-03-18 ENCOUNTER — Ambulatory Visit (INDEPENDENT_AMBULATORY_CARE_PROVIDER_SITE_OTHER): Admitting: Family Medicine

## 2024-03-18 VITALS — BP 128/71 | HR 51 | Ht 67.0 in | Wt 157.0 lb

## 2024-03-18 DIAGNOSIS — E559 Vitamin D deficiency, unspecified: Secondary | ICD-10-CM

## 2024-03-18 DIAGNOSIS — E785 Hyperlipidemia, unspecified: Secondary | ICD-10-CM | POA: Diagnosis not present

## 2024-03-18 DIAGNOSIS — E118 Type 2 diabetes mellitus with unspecified complications: Secondary | ICD-10-CM

## 2024-03-18 DIAGNOSIS — E611 Iron deficiency: Secondary | ICD-10-CM | POA: Diagnosis not present

## 2024-03-18 DIAGNOSIS — M542 Cervicalgia: Secondary | ICD-10-CM | POA: Diagnosis not present

## 2024-03-18 DIAGNOSIS — I10 Essential (primary) hypertension: Secondary | ICD-10-CM

## 2024-03-18 LAB — POCT GLYCOSYLATED HEMOGLOBIN (HGB A1C): Hemoglobin A1C: 6 % — AB (ref 4.0–5.6)

## 2024-03-18 NOTE — Assessment & Plan Note (Signed)
 A1C looks goo at 6.0 today.  No concerns he is otherwise doing well.  This is an improvement from last A1c.  He says he has not been able to exercise quite as consistently but plans on doing so so I think he is hopeful to bring that down even more.  He is taking an ARB and a statin.

## 2024-03-18 NOTE — Patient Instructions (Addendum)
 If your vitamin D looks good you can change to the over the counter Vitamin D3 daily

## 2024-03-18 NOTE — Progress Notes (Signed)
 Established Patient Office Visit  Subjective  Patient ID: Colton Tassin, male    DOB: 12-29-1945  Age: 78 y.o. MRN: [id number]  Chief Complaint  Patient presents with   Hypertension    HPI  Hypertension- Pt denies chest pain, SOB, dizziness, or heart palpitations.  Taking meds as directed w/o problems.  Denies medication side effects.    Says that over the last 2 to 3 months he has gotten occasional pain that is a sharp shooting pain that comes from the neck area down towards the top of his shoulder it does not go down into his arm sometimes it can be on the right sometimes on the left.  When it happens he is usually able to lay down and then reposition his arms and shoulders and says it will gradually ease off it does not stop abruptly he said when it happened he is usually just been at rest he has not been doing any work or heavy lifting etc.    ROS    Objective:     BP 128/71   Pulse (!) 51   Ht 5\' 7"  (1.702 m)   Wt 157 lb (71.2 kg)   SpO2 100%   BMI 24.59 kg/m    Physical Exam Vitals and nursing note reviewed.  Constitutional:      Appearance: Normal appearance.  HENT:     Head: Normocephalic and atraumatic.  Eyes:     Conjunctiva/sclera: Conjunctivae normal.  Cardiovascular:     Rate and Rhythm: Normal rate and regular rhythm.  Pulmonary:     Effort: Pulmonary effort is normal.     Breath sounds: Normal breath sounds.  Skin:    General: Skin is warm and dry.  Neurological:     Mental Status: He is alert.  Psychiatric:        Mood and Affect: Mood normal.      Results for orders placed or performed in visit on 03/18/24  POCT HgB A1C  Result Value Ref Range   Hemoglobin A1C 6.0 (A) 4.0 - 5.6 %   HbA1c POC (<> result, manual entry)     HbA1c, POC (prediabetic range)     HbA1c, POC (controlled diabetic range)        The ASCVD Risk score (Arnett DK, et al., 2019) failed to calculate for the following reasons:   The valid total cholesterol range  is 130 to 320 mg/dL    Assessment & Plan:   Problem List Items Addressed This Visit       Cardiovascular and Mediastinum   HTN (hypertension) - Primary   Well controlled. Continue current regimen. Follow up in  6 mo       Relevant Orders   VITAMIN D 25 Hydroxy (Vit-D Deficiency, Fractures)   CMP14+EGFR   Ferritin     Endocrine   Controlled diabetes mellitus type 2 with complications (HCC)   A1C looks goo at 6.0 today.  No concerns he is otherwise doing well.  This is an improvement from last A1c.  He says he has not been able to exercise quite as consistently but plans on doing so so I think he is hopeful to bring that down even more.  He is taking an ARB and a statin.      Relevant Orders   VITAMIN D 25 Hydroxy (Vit-D Deficiency, Fractures)   CMP14+EGFR   Ferritin   POCT HgB A1C (Completed)     Other   Hyperlipidemia with target LDL less than 130 (  Chronic)   LDL was under 70 continue with Crestor 5 mg daily call if any problems or concerns.  His 10-year cardiovascular risk score is 20%.      Relevant Orders   VITAMIN D 25 Hydroxy (Vit-D Deficiency, Fractures)   CMP14+EGFR   Ferritin   Vitamin D deficiency   Due to recheck vitamin D hopefully he is back into the normal range and we can just switch to an over-the-counter 25 mcg daily tab.      Relevant Orders   VITAMIN D 25 Hydroxy (Vit-D Deficiency, Fractures)   CMP14+EGFR   Ferritin   Other Visit Diagnoses       Iron deficiency       Relevant Orders   Ferritin     Neck pain, bilateral          Suspect it is the pain that he is experiencing is probably radiating from his cervical spine may be a degenerative disc.  We discussed options including keeping an eye on it seeing if position such as neck flexion and flexion seems to trigger it.  We can always get a plain film for further workup.  Especially if it becomes more frequent, it lasts longer, or he is not able to resolve it with position change.  Does not  sound like it is coming directly from his shoulder joints.  Return in about 6 months (around 09/17/2024) for Pre-diabetes, Hypertension.    Nani Gasser, MD

## 2024-03-18 NOTE — Assessment & Plan Note (Signed)
 Due to recheck vitamin D hopefully he is back into the normal range and we can just switch to an over-the-counter 25 mcg daily tab.

## 2024-03-18 NOTE — Assessment & Plan Note (Signed)
 Well controlled. Continue current regimen. Follow up in  6 mo

## 2024-03-18 NOTE — Assessment & Plan Note (Signed)
 LDL was under 70 continue with Crestor 5 mg daily call if any problems or concerns.  His 10-year cardiovascular risk score is 20%.

## 2024-03-19 ENCOUNTER — Encounter: Payer: Self-pay | Admitting: Family Medicine

## 2024-03-19 LAB — CMP14+EGFR
ALT: 13 IU/L (ref 0–44)
AST: 26 IU/L (ref 0–40)
Albumin: 4.3 g/dL (ref 3.8–4.8)
Alkaline Phosphatase: 59 IU/L (ref 44–121)
BUN/Creatinine Ratio: 13 (ref 10–24)
BUN: 14 mg/dL (ref 8–27)
Bilirubin Total: 0.8 mg/dL (ref 0.0–1.2)
CO2: 24 mmol/L (ref 20–29)
Calcium: 9.5 mg/dL (ref 8.6–10.2)
Chloride: 101 mmol/L (ref 96–106)
Creatinine, Ser: 1.12 mg/dL (ref 0.76–1.27)
Globulin, Total: 3 g/dL (ref 1.5–4.5)
Glucose: 114 mg/dL — ABNORMAL HIGH (ref 70–99)
Potassium: 4.7 mmol/L (ref 3.5–5.2)
Sodium: 138 mmol/L (ref 134–144)
Total Protein: 7.3 g/dL (ref 6.0–8.5)
eGFR: 68 mL/min/{1.73_m2} (ref >59)

## 2024-03-19 LAB — FERRITIN: Ferritin: 31 ng/mL (ref 30–400)

## 2024-03-19 LAB — VITAMIN D 25 HYDROXY (VIT D DEFICIENCY, FRACTURES): Vit D, 25-Hydroxy: 26.7 ng/mL — ABNORMAL LOW (ref 30.0–100.0)

## 2024-03-19 NOTE — Progress Notes (Signed)
 HI Angel Krueger,  Your vitamin D is still low so please let me know how much vitamin D you are taking?  We may need to double up or have you take 2 daily since it does not seem like it is moved very much.  Your metabolic panel overall looks good.  Your iron stores are low so I also want you taking extra iron please let me know what you are taking and how often you are taking it?

## 2024-03-19 NOTE — Progress Notes (Signed)
 Recommend 65 mg of ferrous sulfate.  Or one of the ones called Slow Fe is a good choice as well.  Recommend you starting with 1 a day.  Also just make sure he is not noticing any blood in his urine or stool.

## 2024-03-24 ENCOUNTER — Ambulatory Visit: Payer: Medicare HMO

## 2024-03-24 VITALS — Ht 65.0 in | Wt 155.0 lb

## 2024-03-24 DIAGNOSIS — Z0000 Encounter for general adult medical examination without abnormal findings: Secondary | ICD-10-CM

## 2024-03-24 NOTE — Progress Notes (Signed)
 Subjective:   Arkeem Harts is a 78 y.o. male who presents for Medicare Annual/Subsequent preventive examination.  Visit Complete: Virtual I connected with  Gaston Islam on 03/24/24 by a audio enabled telemedicine application and verified that I am speaking with the correct person using two identifiers.  Patient Location: Home  Provider Location: Office/Clinic  I discussed the limitations of evaluation and management by telemedicine. The patient expressed understanding and agreed to proceed.  Vital Signs: Because this visit was a virtual/telehealth visit, some criteria may be missing or patient reported. Any vitals not documented were not able to be obtained and vitals that have been documented are patient reported.  Patient Medicare AWV questionnaire was completed by the patient on n/a; I have confirmed that all information answered by patient is correct and no changes since this date.  Cardiac Risk Factors include: advanced age (>54men, >70 women);male gender;dyslipidemia;hypertension     Objective:    Today's Vitals   03/24/24 0855  Weight: 155 lb (70.3 kg)  Height: 5\' 5"  (1.651 m)   Body mass index is 25.79 kg/m.     03/24/2024    9:05 AM 03/24/2023    9:01 AM 06/21/2019    9:03 AM  Advanced Directives  Does Patient Have a Medical Advance Directive? No Yes No  Type of Advance Directive  Living will   Does patient want to make changes to medical advance directive?  No - Patient declined   Would patient like information on creating a medical advance directive? No - Patient declined  No - Patient declined    Current Medications (verified) Outpatient Encounter Medications as of 03/24/2024  Medication Sig   brimonidine (ALPHAGAN) 0.2 % ophthalmic solution Place 1 drop into both eyes 3 (three) times daily.   Cholecalciferol (VITAMIN D3) 25 MCG (1000 UT) CAPS Take 1 capsule (1,000 Units total) by mouth daily.   dorzolamide-timolol (COSOPT) 2-0.5 % ophthalmic solution  SMARTSIG:In Eye(s)   latanoprost (XALATAN) 0.005 % ophthalmic solution Place 1 drop into both eyes at bedtime.   losartan (COZAAR) 50 MG tablet TAKE 1 TABLET EVERY DAY   propranolol (INDERAL) 20 MG tablet Take 1 tablet (20 mg total) by mouth 2 (two) times daily.   rosuvastatin (CRESTOR) 5 MG tablet TAKE 1 TABLET EVERY DAY (NEED MD APPOINTMENT)   No facility-administered encounter medications on file as of 03/24/2024.    Allergies (verified) Penicillins   History: Past Medical History:  Diagnosis Date   Generalized headaches    Past Surgical History:  Procedure Laterality Date   COLON SURGERY     Unknown specifics   gastric ulcer     in his ulcers   Family History  Problem Relation Age of Onset   Glaucoma Mother    Social History   Socioeconomic History   Marital status: Married    Spouse name: remilila   Number of children: 2   Years of education: 13   Highest education level: Associate degree: occupational, Scientist, product/process development, or vocational program  Occupational History   Occupation: Statistician    Comment: retired  Tobacco Use   Smoking status: Never   Smokeless tobacco: Never  Vaping Use   Vaping status: Never Used  Substance and Sexual Activity   Alcohol use: Not Currently   Drug use: No   Sexual activity: Yes  Other Topics Concern   Not on file  Social History Narrative   Lives with spouse. He has two children. He enjoys gardening and doing Nurse, mental health.   Social  Drivers of Health   Financial Resource Strain: Low Risk  (03/24/2024)   Overall Financial Resource Strain (CARDIA)    Difficulty of Paying Living Expenses: Not hard at all  Food Insecurity: No Food Insecurity (03/24/2024)   Hunger Vital Sign    Worried About Running Out of Food in the Last Year: Never true    Ran Out of Food in the Last Year: Never true  Transportation Needs: No Transportation Needs (03/24/2024)   PRAPARE - Administrator, Civil Service (Medical): No    Lack of  Transportation (Non-Medical): No  Physical Activity: Sufficiently Active (03/24/2024)   Exercise Vital Sign    Days of Exercise per Week: 3 days    Minutes of Exercise per Session: 60 min  Stress: No Stress Concern Present (03/24/2024)   [employer] of Occupational Health - Occupational Stress Questionnaire    Feeling of Stress : Not at all  Social Connections: Socially Integrated (03/24/2024)   Social Connection and Isolation Panel [NHANES]    Frequency of Communication with Friends and Family: Three times a week    Frequency of Social Gatherings with Friends and Family: Three times a week    Attends Religious Services: More than 4 times per year    Active Member of Clubs or Organizations: Yes    Attends Banker Meetings: Never    Marital Status: Married    Tobacco Counseling Counseling given: Not Answered   Clinical Intake:  Pre-visit preparation completed: Yes  Pain : No/denies pain     BMI - recorded: 25.79 Nutritional Status: BMI 25 -29 Overweight Nutritional Risks: None Diabetes: No  How often do you need to have someone help you when you read instructions, pamphlets, or other written materials from your doctor or pharmacy?: 1 - Never What is the last grade level you completed in school?: 13  Interpreter Needed?: No      Activities of Daily Living    03/24/2024    8:58 AM  In your present state of health, do you have any difficulty performing the following activities:  Hearing? 0  Vision? 0  Difficulty concentrating or making decisions? 0  Walking or climbing stairs? 0  Dressing or bathing? 0  Doing errands, shopping? 0  Preparing Food and eating ? N  Using the Toilet? N  In the past six months, have you accidently leaked urine? N  Do you have problems with loss of bowel control? N  Managing your Medications? N  Managing your Finances? N  Housekeeping or managing your Housekeeping? N    Patient Care Team: Cydney Draft, MD  as PCP - General (Family Medicine) Dewane Forbes, OD as Referring Physician (Optometry)  Indicate any recent Medical Services you may have received from other than [facility] providers in the past year (date may be approximate).     Assessment:   This is a routine wellness examination for Rasul.  Hearing/Vision screen No results found.   Goals Addressed             This Visit's Progress    Patient Stated       He would like to continue to stay healthy.       Depression Screen    03/24/2024    9:03 AM 03/24/2023    9:01 AM 01/21/2023    3:46 PM 04/24/2021    8:40 AM 06/21/2019    9:04 AM 12/17/2017   12:06 PM 12/17/2017    8:12 AM  PHQ  2/9 Scores  PHQ - 2 Score 0 0 0 0 1 0 0    Fall Risk    03/24/2024    9:05 AM 03/18/2024    8:14 AM 03/24/2023    9:01 AM 01/21/2023    3:45 PM 07/23/2021   10:21 AM  Fall Risk   Falls in the past year? 0 0 0 0 0  Number falls in past yr: 0 0 0 0 0  Injury with Fall? 0 0 0 0 0  Risk for fall due to : No Fall Risks No Fall Risks No Fall Risks No Fall Risks   Follow up Falls evaluation completed Falls evaluation completed Falls evaluation completed Falls evaluation completed     MEDICARE RISK AT HOME: Medicare Risk at Home Any stairs in or around the home?: No Home free of loose throw rugs in walkways, pet beds, electrical cords, etc?: Yes Adequate lighting in your home to reduce risk of falls?: Yes Life alert?: No Use of a cane, walker or w/c?: No Grab bars in the bathroom?: No Shower chair or bench in shower?: No Elevated toilet seat or a handicapped toilet?: No  TIMED UP AND GO:  Was the test performed?  No    Cognitive Function:        03/24/2024    9:07 AM 03/24/2023    9:04 AM 06/21/2019    9:04 AM  6CIT Screen  What Year? 0 points 0 points   What month? 0 points 0 points   What time? 0 points 0 points 0 points  Count back from 20 0 points 2 points 0 points  Months in reverse 0 points 0 points 2 points  Repeat phrase  0 points 4 points 0 points  Total Score 0 points 6 points     Immunizations Immunization History  Administered Date(s) Administered   Fluad Quad(high Dose 65+) 09/20/2022   Fluad Trivalent(High Dose 65+) 08/26/2023   Hepatitis A, Adult 09/09/2016   Influenza Whole 09/08/2005   Influenza, High Dose Seasonal PF 10/24/2018, 09/06/2019   Influenza,inj,Quad PF,6+ Mos 09/09/2016   Influenza-Unspecified 08/01/2015, 09/22/2017, 09/08/2020, 08/23/2021   Janssen (J&J) SARS-COV-2 Vaccination 02/20/2020, 10/06/2020   PFIZER SARS-COV-2 Pediatric Vaccination 5-35yrs 02/25/2021   PNEUMOCOCCAL CONJUGATE-20 02/25/2021   Pfizer(Comirnaty)Fall Seasonal Vaccine 12 years and older 09/20/2022   Pneumococcal Conjugate-13 01/27/2015   Pneumococcal Polysaccharide-23 02/15/2013   Tdap 02/03/2013, 09/20/2022   Typhoid Live 07/24/2015   Zoster Recombinant(Shingrix) 06/15/2020, 09/13/2020   Zoster, Live 08/01/2015    TDAP status: Up to date  Flu Vaccine status: Up to date  Pneumococcal vaccine status: Up to date  Covid-19 vaccine status: Information provided on how to obtain vaccines.   Qualifies for Shingles Vaccine? Yes   Zostavax completed Yes   Shingrix Completed?: Yes  Screening Tests Health Maintenance  Topic Date Due   OPHTHALMOLOGY EXAM  Never done   COVID-19 Vaccine (5 - 2024-25 season) 08/10/2023   INFLUENZA VACCINE  07/09/2024   HEMOGLOBIN A1C  09/17/2024   Diabetic kidney evaluation - Urine ACR  11/26/2024   Diabetic kidney evaluation - eGFR measurement  03/18/2025   FOOT EXAM  03/18/2025   Medicare Annual Wellness (AWV)  03/24/2025   DTaP/Tdap/Td (3 - Td or Tdap) 09/20/2032   Pneumonia Vaccine 32+ Years old  Completed   Hepatitis C Screening  Completed   Zoster Vaccines- Shingrix  Completed   HPV VACCINES  Aged Out   Meningococcal B Vaccine  Aged Out   Colonoscopy  Discontinued  Health Maintenance  Health Maintenance Due  Topic Date Due   OPHTHALMOLOGY EXAM  Never done    COVID-19 Vaccine (5 - 2024-25 season) 08/10/2023    Colorectal cancer screening: No longer required.   Lung Cancer Screening: (Low Dose CT Chest recommended if Age 88-80 years, 20 pack-year currently smoking OR have quit w/in 15years.) does not qualify.   Lung Cancer Screening Referral: n/a  Additional Screening:  Hepatitis C Screening: does not qualify; Completed 09/09/2016  Vision Screening: Recommended annual ophthalmology exams for early detection of glaucoma and other disorders of the eye. Is the patient up to date with their annual eye exam?  Yes  Who is the provider or what is the name of the office in which the patient attends annual eye exams? Clorinda Daniel, OD If pt is not established with a provider, would they like to be referred to a provider to establish care?  N/a .   Dental Screening: Recommended annual dental exams for proper oral hygiene  Diabetic Foot Exam: Diabetic Foot Exam: Completed 03/18/2024  Community Resource Referral / Chronic Care Management: CRR required this visit?  No   CCM required this visit?  No     Plan:     I have personally reviewed and noted the following in the patient's chart:   Medical and social history Use of alcohol, tobacco or illicit drugs  Current medications and supplements including opioid prescriptions. Patient is not currently taking opioid prescriptions. Functional ability and status Nutritional status Physical activity Advanced directives List of other physicians Hospitalizations, surgeries, and ER visits in previous 12 months. None Vitals Screenings to include cognitive, depression, and falls Referrals and appointments  In addition, I have reviewed and discussed with patient certain preventive protocols, quality metrics, and best practice recommendations. A written personalized care plan for preventive services as well as general preventive health recommendations were provided to patient.     Aubrey Leaf, CMA   03/24/2024   After Visit Summary: (MyChart) Due to this being a telephonic visit, the after visit summary with patients personalized plan was offered to patient via MyChart   Nurse Notes:   Ayinde Swim is a 78 y.o. male patient of Metheney, Corita Diego, MD who had a Medicare Annual Wellness Visit today. Brantley is Retired and lives with their spouse. He has 2 children. He reports that he is socially active and does interact with friends/family regularly. He is moderately physically active and enjoys gardening.  Sent fax for last DM eye exam.

## 2024-03-24 NOTE — Patient Instructions (Signed)
  Angel Krueger , Thank you for taking time to come for your Medicare Wellness Visit. I appreciate your ongoing commitment to your health goals. Please review the following plan we discussed and let me know if I can assist you in the future.   These are the goals we discussed:  Goals       Exercise 150 min/wk Moderate Activity      Medication Management      Patient Goals/Self-Care Activities Over the next 180 days, patient will:  take medications as prescribed  Follow Up Plan: Telephone follow up appointment with care management team member scheduled for:  6 months       Patient Stated (pt-stated)      Patient stated that he would like to stay healthy.      Patient Stated      He would like to continue to stay healthy.         This is a list of the screening recommended for you and due dates:  Health Maintenance  Topic Date Due   Eye exam for diabetics  Never done   COVID-19 Vaccine (5 - 2024-25 season) 08/10/2023   Flu Shot  07/09/2024   Hemoglobin A1C  09/17/2024   Yearly kidney health urinalysis for diabetes  11/26/2024   Yearly kidney function blood test for diabetes  03/18/2025   Complete foot exam   03/18/2025   Medicare Annual Wellness Visit  03/24/2025   DTaP/Tdap/Td vaccine (3 - Td or Tdap) 09/20/2032   Pneumonia Vaccine  Completed   Hepatitis C Screening  Completed   Zoster (Shingles) Vaccine  Completed   HPV Vaccine  Aged Out   Meningitis B Vaccine  Aged Out   Colon Cancer Screening  Discontinued

## 2024-05-06 ENCOUNTER — Other Ambulatory Visit: Payer: Self-pay | Admitting: Family Medicine

## 2024-05-06 DIAGNOSIS — I10 Essential (primary) hypertension: Secondary | ICD-10-CM

## 2024-05-10 ENCOUNTER — Other Ambulatory Visit: Payer: Self-pay | Admitting: Family Medicine

## 2024-05-12 ENCOUNTER — Other Ambulatory Visit: Payer: Self-pay | Admitting: Family Medicine

## 2024-05-12 DIAGNOSIS — K649 Unspecified hemorrhoids: Secondary | ICD-10-CM

## 2024-05-12 DIAGNOSIS — K625 Hemorrhage of anus and rectum: Secondary | ICD-10-CM

## 2024-08-10 ENCOUNTER — Encounter: Payer: Self-pay | Admitting: Sports Medicine

## 2024-08-12 DIAGNOSIS — H524 Presbyopia: Secondary | ICD-10-CM | POA: Diagnosis not present

## 2024-08-13 ENCOUNTER — Ambulatory Visit

## 2024-09-10 NOTE — Progress Notes (Signed)
 Angel Krueger                                          MRN: [id number]   09/10/2024   The [facility]BCI Quality Team Specialist reviewed this patient medical record for the purposes of chart review for care gap closure. The following were reviewed: chart review for care gap closure-kidney health evaluation for diabetes:eGFR  and uACR.    VBCI Quality Team

## 2024-09-16 ENCOUNTER — Encounter: Payer: Self-pay | Admitting: Family Medicine

## 2024-09-16 ENCOUNTER — Ambulatory Visit (INDEPENDENT_AMBULATORY_CARE_PROVIDER_SITE_OTHER): Admitting: Family Medicine

## 2024-09-16 VITALS — BP 131/76 | HR 56 | Ht 67.0 in | Wt 155.1 lb

## 2024-09-16 DIAGNOSIS — E118 Type 2 diabetes mellitus with unspecified complications: Secondary | ICD-10-CM

## 2024-09-16 DIAGNOSIS — E785 Hyperlipidemia, unspecified: Secondary | ICD-10-CM

## 2024-09-16 DIAGNOSIS — E559 Vitamin D deficiency, unspecified: Secondary | ICD-10-CM

## 2024-09-16 DIAGNOSIS — R790 Abnormal level of blood mineral: Secondary | ICD-10-CM

## 2024-09-16 DIAGNOSIS — Z23 Encounter for immunization: Secondary | ICD-10-CM

## 2024-09-16 LAB — POCT GLYCOSYLATED HEMOGLOBIN (HGB A1C): Hemoglobin A1C: 6.1 % — AB (ref 4.0–5.6)

## 2024-09-16 NOTE — Progress Notes (Signed)
 Established Patient Office Visit  Subjective  Patient ID: Angel Krueger, male    DOB: [birth year]  Age: 78 y.o. MRN: [id number]  Chief Complaint  Patient presents with   Diabetes   Hypertension    HPI Discussed the use of AI scribe software for clinical note transcription with the patient, who gave verbal consent to proceed.  History of Present Illness Angel Krueger is a 78 year old male who presents for a routine follow-up visit.  Dyspnea at high altitude - Experienced difficulty breathing while visiting [address] in [place], attributed to high elevation - Breathing normalized after returning to lower altitudes - No ongoing respiratory symptoms  Glycemic control - A1c stable at 6.1 - No symptoms of hyperglycemia or hypoglycemia - No diabetes-related complications reported  Hypertension management - Continues to take antihypertensive medication regularly - No symptoms of uncontrolled blood pressure such as headache, dizziness, or vision changes  Ophthalmologic health - Using eye drops for glaucoma - No vision problems during day or night - Undergoes ophthalmologic check-ups every six months, [facility] - Occasional use of reading glasses  Immunization status - Received influenza and COVID vaccines recently - Uncertain about RSV vaccine status and plans to verify with pharmacy - Received both shingles vaccines - Received tetanus vaccine in 2023 - Received pneumonia vaccine three years ago  Travel plans - Plans to travel to [place] for two months next month - Has family in [place]     ROS    Objective:     BP 131/76   Pulse (!) 56   Ht 5' 7 (1.702 m)   Wt 155 lb 1.9 oz (70.4 kg)   SpO2 100%   BMI 24.30 kg/m    Physical Exam Vitals and nursing note reviewed.  Constitutional:      Appearance: Normal appearance.  HENT:     Head: Normocephalic and atraumatic.  Eyes:     Conjunctiva/sclera: Conjunctivae normal.  Cardiovascular:      Rate and Rhythm: Normal rate and regular rhythm.  Pulmonary:     Effort: Pulmonary effort is normal.     Breath sounds: Normal breath sounds.  Skin:    General: Skin is warm and dry.  Neurological:     Mental Status: He is alert.  Psychiatric:        Mood and Affect: Mood normal.      Results for orders placed or performed in visit on 09/16/24  POCT HgB A1C  Result Value Ref Range   Hemoglobin A1C 6.1 (A) 4.0 - 5.6 %   HbA1c POC (<> result, manual entry)     HbA1c, POC (prediabetic range)     HbA1c, POC (controlled diabetic range)        The ASCVD Risk score (Arnett DK, et al., 2019) failed to calculate for the following reasons:   The valid total cholesterol range is 130 to 320 mg/dL    Assessment & Plan:   Problem List Items Addressed This Visit       Other   Hyperlipidemia with target LDL less than 130 (Chronic)   Relevant Orders   Vitamin D  (25 hydroxy)   CMP14+EGFR   Lipid panel   Iron, TIBC and Ferritin Panel   Vitamin D  deficiency   Relevant Orders   POCT HgB A1C (Completed)   Vitamin D  (25 hydroxy)   CMP14+EGFR   Lipid panel   Iron, TIBC and Ferritin Panel   Low ferritin   Relevant Orders  Iron, TIBC and Ferritin Panel   Other Visit Diagnoses       Controlled type 2 diabetes mellitus with complication, without long-term current use of insulin (HCC)    -  Primary   Relevant Orders   POCT HgB A1C (Completed)   Vitamin D  (25 hydroxy)   CMP14+EGFR   Lipid panel   Iron, TIBC and Ferritin Panel     Encounter for immunization       Relevant Orders   Pfizer Comirnaty Covid-19 Vaccine 50yrs & older (Completed)       Assessment and Plan Assessment & Plan Type 2 Diabetes Mellitus Well-controlled with A1c of 6.1. - Continue current diabetes management regimen. - Perform blood work today to monitor diabetes control.  Hypertension Managed with medication. - Continue current blood pressure medication regimen. - Perform blood work today to  monitor kidney and liver function.  Hyperlipidemia Managed with rosuvastatin . - Continue rosuvastatin , take at bedtime.  Glaucoma Well-maintained with eye drops. - Continue current eye drop regimen. - Obtain a copy of the most recent eye exam after the October 21st appointment at [facility].  Vitamin D  Deficiency Supplementation recommended for bone health. - Take vitamin D3 supplement, especially in fall and winter months.  General Health Maintenance Up to date with vaccinations. Discussed RSV vaccine for those 75 and older. - Check [place]  Immunization Registry for RSV vaccine status. - If not received, get RSV vaccine at the pharmacy. - Perform blood work today for general health monitoring.    Return in about 6 months (around 03/17/2025) for Wellness Exam.    Dorothyann Byars, MD

## 2024-09-17 ENCOUNTER — Ambulatory Visit: Payer: Self-pay | Admitting: Family Medicine

## 2024-09-17 LAB — CMP14+EGFR
ALT: 16 IU/L (ref 0–44)
AST: 22 IU/L (ref 0–40)
Albumin: 4.4 g/dL (ref 3.8–4.8)
Alkaline Phosphatase: 60 IU/L (ref 47–123)
BUN/Creatinine Ratio: 15 (ref 10–24)
BUN: 17 mg/dL (ref 8–27)
Bilirubin Total: 1.1 mg/dL (ref 0.0–1.2)
CO2: 23 mmol/L (ref 20–29)
Calcium: 9.7 mg/dL (ref 8.6–10.2)
Chloride: 102 mmol/L (ref 96–106)
Creatinine, Ser: 1.14 mg/dL (ref 0.76–1.27)
Globulin, Total: 3.1 g/dL (ref 1.5–4.5)
Glucose: 108 mg/dL — ABNORMAL HIGH (ref 70–99)
Potassium: 4.7 mmol/L (ref 3.5–5.2)
Sodium: 139 mmol/L (ref 134–144)
Total Protein: 7.5 g/dL (ref 6.0–8.5)
eGFR: 67 mL/min/1.73 (ref >59)

## 2024-09-17 LAB — LIPID PANEL
Chol/HDL Ratio: 3 ratio (ref 0.0–5.0)
Cholesterol, Total: 137 mg/dL (ref 100–199)
HDL: 45 mg/dL (ref >39)
LDL Chol Calc (NIH): 76 mg/dL (ref 0–99)
Triglycerides: 82 mg/dL (ref 0–149)
VLDL Cholesterol Cal: 16 mg/dL (ref 5–40)

## 2024-09-17 LAB — IRON,TIBC AND FERRITIN PANEL
Ferritin: 167 ng/mL (ref 30–400)
Iron Saturation: 70 % — ABNORMAL HIGH (ref 15–55)
Iron: 246 ug/dL — ABNORMAL HIGH (ref 38–169)
Total Iron Binding Capacity: 351 ug/dL (ref 250–450)
UIBC: 105 ug/dL — ABNORMAL LOW (ref 111–343)

## 2024-09-17 LAB — VITAMIN D 25 HYDROXY (VIT D DEFICIENCY, FRACTURES): Vit D, 25-Hydroxy: 33.8 ng/mL (ref 30.0–100.0)

## 2024-09-17 NOTE — Progress Notes (Signed)
 Hi Babu,  Your metabolic panel looks good.  Your iron levels also look great.  In fact if you are taking extra iron you can stop or decrease how often.  Your vitamin D  is coming up nicely so it definitely is better than 6 months ago.  Please continue the vitamin D  supplementation.  Cholesterol looks great.

## 2024-09-22 NOTE — Progress Notes (Signed)
LMVM for patient to return call. 

## 2024-09-28 DIAGNOSIS — H4052X2 Glaucoma secondary to other eye disorders, left eye, moderate stage: Secondary | ICD-10-CM | POA: Diagnosis not present

## 2024-09-28 DIAGNOSIS — H4051X3 Glaucoma secondary to other eye disorders, right eye, severe stage: Secondary | ICD-10-CM | POA: Diagnosis not present

## 2024-09-28 DIAGNOSIS — Z961 Presence of intraocular lens: Secondary | ICD-10-CM | POA: Diagnosis not present

## 2025-03-17 ENCOUNTER — Encounter: Admitting: Family Medicine

## 2025-03-29 ENCOUNTER — Ambulatory Visit
